# Patient Record
Sex: Male | Born: 1980 | Race: White | Hispanic: No | Marital: Single | Smoking: Never smoker
Health system: Southern US, Community
[De-identification: ages and names within clinical notes are randomized; demographics above are authoritative.]

## PROBLEM LIST (undated history)

## (undated) DIAGNOSIS — E785 Hyperlipidemia, unspecified: Secondary | ICD-10-CM

## (undated) DIAGNOSIS — Q984 Klinefelter syndrome, unspecified: Secondary | ICD-10-CM

## (undated) DIAGNOSIS — G8929 Other chronic pain: Secondary | ICD-10-CM

## (undated) DIAGNOSIS — M549 Dorsalgia, unspecified: Secondary | ICD-10-CM

## (undated) DIAGNOSIS — R519 Headache, unspecified: Secondary | ICD-10-CM

## (undated) DIAGNOSIS — K219 Gastro-esophageal reflux disease without esophagitis: Secondary | ICD-10-CM

## (undated) DIAGNOSIS — F419 Anxiety disorder, unspecified: Secondary | ICD-10-CM

## (undated) DIAGNOSIS — R51 Headache: Secondary | ICD-10-CM

## (undated) DIAGNOSIS — J40 Bronchitis, not specified as acute or chronic: Secondary | ICD-10-CM

## (undated) DIAGNOSIS — E291 Testicular hypofunction: Secondary | ICD-10-CM

## (undated) HISTORY — DX: Other chronic pain: G89.29

## (undated) HISTORY — DX: Headache, unspecified: R51.9

## (undated) HISTORY — DX: Anxiety disorder, unspecified: F41.9

## (undated) HISTORY — DX: Gastro-esophageal reflux disease without esophagitis: K21.9

## (undated) HISTORY — DX: Testicular hypofunction: E29.1

## (undated) HISTORY — PX: HERNIA REPAIR: SHX51

## (undated) HISTORY — DX: Headache: R51

## (undated) HISTORY — DX: Hyperlipidemia, unspecified: E78.5

## (undated) HISTORY — PX: NECK SURGERY: SHX720

## (undated) HISTORY — DX: Dorsalgia, unspecified: M54.9

## (undated) HISTORY — DX: Klinefelter syndrome, unspecified: Q98.4

---

## 2005-07-25 ENCOUNTER — Emergency Department (HOSPITAL_COMMUNITY): Admission: EM | Admit: 2005-07-25 | Discharge: 2005-07-25 | Payer: Self-pay | Admitting: Emergency Medicine

## 2005-10-16 ENCOUNTER — Emergency Department (HOSPITAL_COMMUNITY): Admission: EM | Admit: 2005-10-16 | Discharge: 2005-10-16 | Payer: Self-pay | Admitting: Emergency Medicine

## 2006-03-19 ENCOUNTER — Emergency Department (HOSPITAL_COMMUNITY): Admission: EM | Admit: 2006-03-19 | Discharge: 2006-03-20 | Payer: Self-pay | Admitting: Emergency Medicine

## 2006-12-14 ENCOUNTER — Emergency Department (HOSPITAL_COMMUNITY): Admission: EM | Admit: 2006-12-14 | Discharge: 2006-12-14 | Payer: Self-pay | Admitting: Emergency Medicine

## 2006-12-19 ENCOUNTER — Emergency Department (HOSPITAL_COMMUNITY): Admission: EM | Admit: 2006-12-19 | Discharge: 2006-12-20 | Payer: Self-pay | Admitting: Emergency Medicine

## 2007-01-25 ENCOUNTER — Emergency Department (HOSPITAL_COMMUNITY): Admission: EM | Admit: 2007-01-25 | Discharge: 2007-01-26 | Payer: Self-pay | Admitting: Emergency Medicine

## 2007-03-13 ENCOUNTER — Emergency Department (HOSPITAL_COMMUNITY): Admission: EM | Admit: 2007-03-13 | Discharge: 2007-03-13 | Payer: Self-pay | Admitting: Emergency Medicine

## 2007-04-25 ENCOUNTER — Emergency Department (HOSPITAL_COMMUNITY): Admission: EM | Admit: 2007-04-25 | Discharge: 2007-04-25 | Payer: Self-pay | Admitting: Emergency Medicine

## 2007-09-02 ENCOUNTER — Emergency Department (HOSPITAL_COMMUNITY): Admission: EM | Admit: 2007-09-02 | Discharge: 2007-09-02 | Payer: Self-pay | Admitting: Emergency Medicine

## 2008-11-29 ENCOUNTER — Emergency Department (HOSPITAL_COMMUNITY): Admission: EM | Admit: 2008-11-29 | Discharge: 2008-11-29 | Payer: Self-pay | Admitting: Emergency Medicine

## 2008-12-01 ENCOUNTER — Emergency Department (HOSPITAL_COMMUNITY): Admission: EM | Admit: 2008-12-01 | Discharge: 2008-12-01 | Payer: Self-pay | Admitting: Emergency Medicine

## 2008-12-02 ENCOUNTER — Emergency Department (HOSPITAL_COMMUNITY): Admission: EM | Admit: 2008-12-02 | Discharge: 2008-12-02 | Payer: Self-pay | Admitting: Emergency Medicine

## 2009-05-09 ENCOUNTER — Emergency Department (HOSPITAL_COMMUNITY): Admission: EM | Admit: 2009-05-09 | Discharge: 2009-05-09 | Payer: Self-pay | Admitting: Emergency Medicine

## 2009-05-09 ENCOUNTER — Ambulatory Visit: Payer: Self-pay | Admitting: Psychiatry

## 2009-05-10 ENCOUNTER — Inpatient Hospital Stay (HOSPITAL_COMMUNITY): Admission: AD | Admit: 2009-05-10 | Discharge: 2009-05-12 | Payer: Self-pay | Admitting: Psychiatry

## 2009-07-16 ENCOUNTER — Emergency Department: Payer: Self-pay | Admitting: Emergency Medicine

## 2010-01-03 ENCOUNTER — Emergency Department (HOSPITAL_COMMUNITY)
Admission: EM | Admit: 2010-01-03 | Discharge: 2010-01-04 | Payer: Self-pay | Source: Home / Self Care | Admitting: Emergency Medicine

## 2010-01-26 ENCOUNTER — Ambulatory Visit (HOSPITAL_COMMUNITY)
Admission: RE | Admit: 2010-01-26 | Discharge: 2010-01-26 | Payer: Self-pay | Source: Home / Self Care | Attending: Family Medicine | Admitting: Family Medicine

## 2010-04-25 LAB — BASIC METABOLIC PANEL
BUN: 16 mg/dL (ref 6–23)
CO2: 27 mEq/L (ref 19–32)
Calcium: 8.7 mg/dL (ref 8.4–10.5)
Chloride: 99 mEq/L (ref 96–112)
Creatinine, Ser: 1.42 mg/dL (ref 0.4–1.5)
GFR calc Af Amer: >60 mL/min (ref >60)
Glucose, Bld: 95 mg/dL (ref 70–99)

## 2010-04-25 LAB — POCT CARDIAC MARKERS: Myoglobin, poc: 72.6 ng/mL (ref 12–200)

## 2010-04-25 LAB — RAPID URINE DRUG SCREEN, HOSP PERFORMED
Amphetamines: NOT DETECTED
Barbiturates: NOT DETECTED
Benzodiazepines: POSITIVE — AB
Tetrahydrocannabinol: NOT DETECTED

## 2010-04-25 LAB — DIFFERENTIAL
Basophils Absolute: 0 10*3/uL (ref 0.0–0.1)
Basophils Relative: 0 % (ref 0–1)
Eosinophils Absolute: 0.2 10*3/uL (ref 0.0–0.7)
Monocytes Relative: 7 % (ref 3–12)
Neutro Abs: 4.4 10*3/uL (ref 1.7–7.7)
Neutrophils Relative %: 59 % (ref 43–77)

## 2010-04-25 LAB — CBC
MCHC: 34.3 g/dL (ref 30.0–36.0)
MCV: 86.4 fL (ref 78.0–100.0)
Platelets: 287 10*3/uL (ref 150–400)
RDW: 15 % (ref 11.5–15.5)

## 2010-04-25 LAB — VALPROIC ACID LEVEL: Valproic Acid Lvl: 22 ug/mL — ABNORMAL LOW (ref 50.0–100.0)

## 2010-05-10 LAB — CULTURE, ROUTINE-ABSCESS

## 2010-11-02 LAB — URINE MICROSCOPIC-ADD ON

## 2010-11-02 LAB — POCT I-STAT, CHEM 8
BUN: 15
Calcium, Ion: 1.22
Chloride: 105
Creatinine, Ser: 1.4
Glucose, Bld: 93
HCT: 41
Hemoglobin: 13.9
Potassium: 3.9
Sodium: 139
TCO2: 29

## 2010-11-02 LAB — URINALYSIS, ROUTINE W REFLEX MICROSCOPIC
Bilirubin Urine: NEGATIVE
Glucose, UA: NEGATIVE
Ketones, ur: NEGATIVE
Leukocytes, UA: NEGATIVE
Nitrite: NEGATIVE
Protein, ur: NEGATIVE
Specific Gravity, Urine: 1.021
Urobilinogen, UA: 0.2
pH: 5.5

## 2010-11-02 LAB — URINE CULTURE
Colony Count: NO GROWTH
Culture: NO GROWTH

## 2010-12-31 ENCOUNTER — Emergency Department (HOSPITAL_COMMUNITY): Payer: Self-pay

## 2010-12-31 ENCOUNTER — Emergency Department (HOSPITAL_COMMUNITY)
Admission: EM | Admit: 2010-12-31 | Discharge: 2011-01-01 | Disposition: A | Discharge status: Home / Self Care | Payer: Self-pay | Attending: Emergency Medicine | Admitting: Emergency Medicine

## 2010-12-31 DIAGNOSIS — R109 Unspecified abdominal pain: Secondary | ICD-10-CM | POA: Insufficient documentation

## 2010-12-31 DIAGNOSIS — Z87442 Personal history of urinary calculi: Secondary | ICD-10-CM | POA: Insufficient documentation

## 2010-12-31 DIAGNOSIS — M549 Dorsalgia, unspecified: Secondary | ICD-10-CM | POA: Insufficient documentation

## 2010-12-31 LAB — URINALYSIS, ROUTINE W REFLEX MICROSCOPIC
Glucose, UA: NEGATIVE mg/dL
Hgb urine dipstick: NEGATIVE
Specific Gravity, Urine: 1.01 (ref 1.005–1.030)
pH: 6 (ref 5.0–8.0)

## 2010-12-31 NOTE — ED Notes (Signed)
MD at bedside. 

## 2010-12-31 NOTE — ED Notes (Signed)
Pt states has had increasing flank pain bilaterally x 2 weeks. Pt states urine is very strong and dark colored.  Pt denies fever and vomiting, however has been nauseated.

## 2010-12-31 NOTE — ED Notes (Signed)
bil flank pain --right started 2 weeks ago, left 1 week ago, strong odor to urine.  Pain worse tonight, +nausea-at 11-12am.

## 2010-12-31 NOTE — ED Provider Notes (Signed)
Scribed for Sunnie Nielsen, MD, the patient was seen in room APA15/APA15 . This chart was scribed by Ellie Lunch.   CSN: [id number] Arrival date & time: 12/31/2010 10:40 PM   First MD Initiated Contact with Patient 12/31/10 2305      Chief Complaint  Patient presents with  . Flank Pain    (Consider location/radiation/quality/duration/timing/severity/associated sxs/prior treatment) HPI Pt seen at 11:32 PM  Garrett Daugherty is a 30 y.o. male who presents to the Emergency Department complaining of 2 weeks of constant bilateral flank pain with associated malodorous urine. Pain in right flank is greater than left.  Pain is currently rated 6/10 in severity. Pt denies associated hematuria, dysuria, fevers, n/v/d. Pt has h/o of kidney stones. Denies that current Sx are similar to previous kidney stones. Pt treated with IB profen this morning with no improvement.  PCP Dr. Lubertha South  History reviewed. No pertinent past medical history.  History reviewed. No pertinent past surgical history.  No family history on file.  History  Substance Use Topics  . Smoking status: Never Smoker   . Smokeless tobacco: Not on file  . Alcohol Use: No     Review of Systems 10 Systems reviewed and are negative for acute change except as noted in the HPI.  Allergies  Keflex; Penicillins; and Sulfa antibiotics  Home Medications   Current Outpatient Rx  Name Route Sig Dispense Refill  . AMITRIPTYLINE HCL 25 MG PO TABS Oral Take 25 mg by mouth 2 (two) times daily.      Marland Kitchen DIAZEPAM 5 MG PO TABS Oral Take 5 mg by mouth 4 (four) times daily.      Marland Kitchen HYDROCODONE-ACETAMINOPHEN 10-325 MG PO TABS Oral Take 1 tablet by mouth every 6 (six) hours as needed. For pain     . IBUPROFEN 800 MG PO TABS Oral Take 800 mg by mouth daily as needed. For pain     . TESTOSTERONE 1.25 GM/ACT (1%) TD GEL Transdermal Place 2 packets onto the skin daily.        BP 116/88  Pulse 80  Temp(Src) 98.8 F (37.1 C) (Oral)  Resp  20  Ht 6\' 1"  (1.854 m)  Wt 230 lb (104.327 kg)  BMI 30.34 kg/m2  SpO2 100%  Physical Exam  Nursing note and vitals reviewed. Constitutional: He is oriented to person, place, and time. He appears well-developed and well-nourished.  HENT:  Head: Normocephalic and atraumatic.  Eyes: Conjunctivae and EOM are normal.  Neck: Neck supple.  Cardiovascular: Normal rate and regular rhythm.   Pulmonary/Chest: Effort normal. No respiratory distress.  Abdominal: Soft. There is no tenderness.       Pt. Localizes pain in right flank more than left flank. No cva tenderness on examination  Neurological: He is alert and oriented to person, place, and time.  Skin: Skin is warm and dry.    ED Course  Procedures (including critical care time) OTHER DATA REVIEWED: Nursing notes, vital signs, and past medical records reviewed.   DIAGNOSTIC STUDIES: Oxygen Saturation is 100% on room air, normal by my interpretation.     Results for orders placed during the hospital encounter of 12/31/10  URINALYSIS, ROUTINE W REFLEX MICROSCOPIC      Component Value Range   Color, Urine STRAW (*) YELLOW    Appearance CLEAR  CLEAR    Specific Gravity, Urine 1.010  1.005 - 1.030    pH 6.0  5.0 - 8.0    Glucose, UA NEGATIVE  NEGATIVE (mg/dL)  Hgb urine dipstick NEGATIVE  NEGATIVE    Bilirubin Urine NEGATIVE  NEGATIVE    Ketones, ur NEGATIVE  NEGATIVE (mg/dL)   Protein, ur NEGATIVE  NEGATIVE (mg/dL)   Urobilinogen, UA 0.2  0.0 - 1.0 (mg/dL)   Nitrite NEGATIVE  NEGATIVE    Leukocytes, UA NEGATIVE  NEGATIVE    Ct Abdomen Pelvis Wo Contrast  01/01/2011  *RADIOLOGY REPORT*  Clinical Data: Bilateral flank pain and history of renal calculi.  CT ABDOMEN AND PELVIS WITHOUT CONTRAST  Technique:  Multidetector CT imaging of the abdomen and pelvis was performed following the standard protocol without intravenous contrast.  Comparison: 09/02/2007  Findings: No evidence of hydronephrosis or intrarenal calculi bilaterally.   The ureters are normal without calculi or dilatation. The bladder is unremarkable.  A small left inguinal hernia is present containing fat.  No evidence of inflammation or fluid in the small hernia.  Bowel loops are unremarkable.  Normal appendix is visualized.  No inflammatory process, abnormal fluid collection or mass identified by unenhanced CT.  Unenhanced solid organs in the abdomen are unremarkable.  No abnormal calcifications.  Bony structures are within normal limits.  IMPRESSION:  1.  No evidence of renal calculi, ureteral calculi or obstruction. 2.  Small left inguinal hernia containing fat.  Original Report Authenticated By: Reola Calkins, M.D.    MDM   Flank pain with UA and CT scan reviewed as above. No obvious etiology for symptoms by workup. Urine culture sent. Patient does have history of back pain and may be the etiology for his symptoms tonight. Pain improved emergency department with IM Dilaudid. Plan discharge home with Flexeril and NSAIDs as needed. Patient agrees to followup with his physician for recheck and results of urine culture.  I personally performed the services described in this documentation, which was scribed in my presence. The recorded information has been reviewed and considered.         Sunnie Nielsen, MD 01/01/11 [phone]

## 2011-01-01 MED ORDER — CYCLOBENZAPRINE HCL 10 MG PO TABS: 10.0000 mg | ORAL_TABLET | Freq: Two times a day (BID) | ORAL | Status: AC | PRN

## 2011-01-01 MED ORDER — IBUPROFEN 800 MG PO TABS: 800.0000 mg | ORAL_TABLET | Freq: Three times a day (TID) | ORAL | Status: AC

## 2011-01-01 MED ORDER — HYDROMORPHONE HCL PF 1 MG/ML IJ SOLN
1.0000 mg | Freq: Once | INTRAMUSCULAR | Status: AC
  Administered 2011-01-01: 1 mg via INTRAMUSCULAR
  Filled 2011-01-01: qty 1

## 2011-01-01 NOTE — ED Notes (Signed)
Pt refused pain medication at this time. 

## 2011-01-01 NOTE — ED Notes (Signed)
Pt states pain is worsening, states would like to "cahnge his mind now and have pain medication at this time" .  Pt states has a safe ride home.

## 2011-01-02 LAB — URINE CULTURE
Colony Count: NO GROWTH
Culture: NO GROWTH

## 2011-02-05 ENCOUNTER — Emergency Department: Payer: Self-pay | Admitting: Emergency Medicine

## 2011-02-06 ENCOUNTER — Emergency Department (HOSPITAL_COMMUNITY)
Admission: EM | Admit: 2011-02-06 | Discharge: 2011-02-06 | Disposition: A | Discharge status: Home / Self Care | Payer: Self-pay | Attending: Emergency Medicine | Admitting: Emergency Medicine

## 2011-02-06 ENCOUNTER — Encounter (HOSPITAL_COMMUNITY): Payer: Self-pay | Admitting: Oncology

## 2011-02-06 DIAGNOSIS — R05 Cough: Secondary | ICD-10-CM | POA: Insufficient documentation

## 2011-02-06 DIAGNOSIS — Z79899 Other long term (current) drug therapy: Secondary | ICD-10-CM | POA: Insufficient documentation

## 2011-02-06 DIAGNOSIS — IMO0001 Reserved for inherently not codable concepts without codable children: Secondary | ICD-10-CM | POA: Insufficient documentation

## 2011-02-06 DIAGNOSIS — B349 Viral infection, unspecified: Secondary | ICD-10-CM

## 2011-02-06 DIAGNOSIS — B9789 Other viral agents as the cause of diseases classified elsewhere: Secondary | ICD-10-CM | POA: Insufficient documentation

## 2011-02-06 DIAGNOSIS — R070 Pain in throat: Secondary | ICD-10-CM | POA: Insufficient documentation

## 2011-02-06 DIAGNOSIS — H9209 Otalgia, unspecified ear: Secondary | ICD-10-CM | POA: Insufficient documentation

## 2011-02-06 DIAGNOSIS — R509 Fever, unspecified: Secondary | ICD-10-CM | POA: Insufficient documentation

## 2011-02-06 DIAGNOSIS — J3489 Other specified disorders of nose and nasal sinuses: Secondary | ICD-10-CM | POA: Insufficient documentation

## 2011-02-06 DIAGNOSIS — R059 Cough, unspecified: Secondary | ICD-10-CM | POA: Insufficient documentation

## 2011-02-06 HISTORY — DX: Bronchitis, not specified as acute or chronic: J40

## 2011-02-06 MED ORDER — HYDROCOD POLST-CHLORPHEN POLST 10-8 MG/5ML PO LQCR: 5.0000 mL | Freq: Two times a day (BID) | ORAL | Status: DC | PRN

## 2011-02-06 MED ORDER — ALBUTEROL SULFATE HFA 108 (90 BASE) MCG/ACT IN AERS
2.0000 | INHALATION_SPRAY | RESPIRATORY_TRACT | Status: DC | PRN
  Administered 2011-02-06: 2 via RESPIRATORY_TRACT
  Filled 2011-02-06: qty 6.7

## 2011-02-06 NOTE — ED Notes (Signed)
Alert, NAD.  Sick for 3-4 days, sinus and chest congestion. Went to [facility] yesterday, but was not seen.  Says he felt feverish today.

## 2011-02-06 NOTE — ED Notes (Signed)
Pt reports at least 2 days of cold/URI symptoms, sore throat, right earache, fever x today.  Denies n/v/d.  Pt states he has not taken meds for his syx except throat spray.

## 2011-02-07 NOTE — ED Provider Notes (Signed)
History     CSN: [id number]  Arrival date & time 02/06/11  1116   First MD Initiated Contact with Patient 02/06/11 1318      Chief Complaint  Patient presents with  . Cough  . URI  . Sore Throat  . Otalgia    (Consider location/radiation/quality/duration/timing/severity/associated sxs/prior treatment) Patient is a 31 y.o. male presenting with cough, URI, pharyngitis, and ear pain. The history is provided by the patient.  Cough This is a new problem. The current episode started 2 days ago. The problem occurs constantly. The problem has been gradually worsening. The cough is non-productive. The maximum temperature recorded prior to his arrival was 100 to 100.9 F. The fever has been present for less than 1 day. Associated symptoms include chills, ear pain, rhinorrhea, sore throat and myalgias. Pertinent negatives include no chest pain, no shortness of breath and no wheezing. He has tried nothing for the symptoms. The treatment provided no relief. He is not a smoker. His past medical history is significant for bronchitis. His past medical history does not include pneumonia.  URI The primary symptoms include fever, ear pain, sore throat, cough and myalgias. Primary symptoms do not include wheezing, abdominal pain or vomiting. The current episode started 2 days ago. This is a new problem.  The sore throat is not accompanied by trouble swallowing.  Symptoms associated with the illness include chills, congestion and rhinorrhea.  Sore Throat Associated symptoms include chills, congestion, coughing, a fever, myalgias and a sore throat. Pertinent negatives include no abdominal pain, chest pain, neck pain or vomiting.  Otalgia Associated symptoms include rhinorrhea, sore throat and cough. Pertinent negatives include no abdominal pain, no diarrhea, no vomiting and no neck pain.    Past Medical History  Diagnosis Date  . Bronchitis     Past Surgical History  Procedure Date  . Neck surgery    "cysts in neck"    No family history on file.  History  Substance Use Topics  . Smoking status: Never Smoker   . Smokeless tobacco: Not on file  . Alcohol Use: No      Review of Systems  Constitutional: Positive for fever and chills. Negative for activity change and appetite change.  HENT: Positive for ear pain, congestion, sore throat and rhinorrhea. Negative for trouble swallowing, neck pain and neck stiffness.   Respiratory: Positive for cough. Negative for chest tightness, shortness of breath and wheezing.   Cardiovascular: Negative for chest pain.  Gastrointestinal: Negative for vomiting, abdominal pain and diarrhea.  Musculoskeletal: Positive for myalgias.  Skin: Negative.   Hematological: Negative for adenopathy.  All other systems reviewed and are negative.    Allergies  Amoxicillin; Keflex; Penicillins; and Sulfa antibiotics  Home Medications   Current Outpatient Rx  Name Route Sig Dispense Refill  . DIAZEPAM 5 MG PO TABS Oral Take 5 mg by mouth 4 (four) times daily.     . ADULT MULTIVITAMIN W/MINERALS CH Oral Take 1 tablet by mouth daily.      . THROAT SPRAY MT LIQD Mouth/Throat Use as directed 1 spray in the mouth or throat as needed. Sore Throat     . TESTOSTERONE 1.25 GM/ACT (1%) TD GEL Transdermal Place 2 packets onto the skin daily.      Marland Kitchen HYDROCOD POLST-CPM POLST ER 10-8 MG/5ML PO LQCR Oral Take 5 mLs by mouth every 12 (twelve) hours as needed. 120 mL 0    BP 135/94  Pulse 107  Temp(Src) 99.1 F (37.3 C) (Oral)  Resp 18  Ht 6' (1.829 m)  Wt 230 lb (104.327 kg)  BMI 31.19 kg/m2  SpO2 98%  Physical Exam  Nursing note and vitals reviewed. Constitutional: He is oriented to person, place, and time. He appears well-developed and well-nourished. No distress.  HENT:  Head: Normocephalic and atraumatic.  Right Ear: Tympanic membrane and ear canal normal. No mastoid tenderness.  Left Ear: Tympanic membrane and ear canal normal. No mastoid tenderness.    Nose: Mucosal edema and rhinorrhea present.  Mouth/Throat: Uvula is midline, oropharynx is clear and moist and mucous membranes are normal.  Neck: Normal range of motion. Neck supple.  Cardiovascular: Normal rate, regular rhythm and normal heart sounds.   No murmur heard. Pulmonary/Chest: Effort normal and breath sounds normal. No respiratory distress. He has no wheezes. He has no rales. He exhibits no tenderness.  Musculoskeletal: Normal range of motion.  Lymphadenopathy:    He has no cervical adenopathy.  Neurological: He is alert and oriented to person, place, and time. No cranial nerve deficit. He exhibits normal muscle tone. Coordination normal.  Skin: Skin is warm and dry.    ED Course  Procedures (including critical care time)    1. Viral illness       MDM       Patient is alert, non-toxic appearing.  Vitals stable.  Mucous membranes are moist   Kelli Robeck L. [place], [place] 02/07/11 2130

## 2011-02-08 NOTE — ED Provider Notes (Signed)
Medical screening examination/treatment/procedure(s) were performed by non-physician practitioner and as supervising physician I was immediately available for consultation/collaboration.   Caci Orren W. Chaela Branscum, MD 02/08/11 1558 

## 2011-02-25 ENCOUNTER — Encounter (HOSPITAL_COMMUNITY): Payer: Self-pay | Admitting: *Deleted

## 2011-02-25 ENCOUNTER — Emergency Department (HOSPITAL_COMMUNITY)
Admission: EM | Admit: 2011-02-25 | Discharge: 2011-02-25 | Disposition: A | Discharge status: Home / Self Care | Payer: Self-pay | Attending: Emergency Medicine | Admitting: Emergency Medicine

## 2011-02-25 ENCOUNTER — Emergency Department (HOSPITAL_COMMUNITY): Payer: Self-pay

## 2011-02-25 DIAGNOSIS — M549 Dorsalgia, unspecified: Secondary | ICD-10-CM

## 2011-02-25 DIAGNOSIS — W010XXA Fall on same level from slipping, tripping and stumbling without subsequent striking against object, initial encounter: Secondary | ICD-10-CM | POA: Insufficient documentation

## 2011-02-25 DIAGNOSIS — M545 Low back pain, unspecified: Secondary | ICD-10-CM | POA: Insufficient documentation

## 2011-02-25 DIAGNOSIS — M25559 Pain in unspecified hip: Secondary | ICD-10-CM | POA: Insufficient documentation

## 2011-02-25 MED ORDER — HYDROMORPHONE HCL PF 1 MG/ML IJ SOLN
1.0000 mg | Freq: Once | INTRAMUSCULAR | Status: AC
  Administered 2011-02-25: 1 mg via INTRAMUSCULAR
  Filled 2011-02-25: qty 1

## 2011-02-25 MED ORDER — NAPROXEN 500 MG PO TABS: 500.0000 mg | ORAL_TABLET | Freq: Two times a day (BID) | ORAL | Status: DC

## 2011-02-25 MED ORDER — HYDROCODONE-ACETAMINOPHEN 5-325 MG PO TABS: 1.0000 | ORAL_TABLET | Freq: Four times a day (QID) | ORAL | Status: AC | PRN

## 2011-02-25 NOTE — ED Notes (Signed)
Discharge instructions reviewed; verbalizes understanding.  No questions asked; no further c/o's.  Ambulatory to lobby.

## 2011-02-25 NOTE — ED Notes (Signed)
Pt reports falling last night and now having right hip pain. Ambulatory at triage.

## 2011-02-25 NOTE — ED Notes (Signed)
Patient transported to X-ray 

## 2011-02-25 NOTE — ED Provider Notes (Signed)
History     CSN: [id number]  Arrival date & time 02/25/11  1132   First MD Initiated Contact with Patient 02/25/11 1212      Chief Complaint  Patient presents with  . Fall  . Back Pain    (Consider location/radiation/quality/duration/timing/severity/associated sxs/prior treatment) Patient is a 31 y.o. male presenting with fall and back pain. The history is provided by the patient (The patient states that he was on her front porch and slipped and fell onto his back. Patient complained of mild/moderate low back pain with radiation of the pain down his right thigh. She states he has a history of disc problems in his back and is taking ). No language interpreter was used.  Fall The accident occurred yesterday. The fall occurred while walking. He fell from a height of 1 to 2 ft. He landed on a hard floor. There was no blood loss. Point of impact: back. The pain is at a severity of 4/10. The pain is moderate. He was ambulatory at the scene. There was no drug use involved in the accident. Pertinent negatives include no visual change, no fever, no abdominal pain, no hematuria and no headaches. The symptoms are aggravated by activity. He has tried nothing for the symptoms.  Back Pain  Pertinent negatives include no chest pain, no fever, no headaches and no abdominal pain.    Past Medical History  Diagnosis Date  . Bronchitis     Past Surgical History  Procedure Date  . Neck surgery     "cysts in neck"    History reviewed. No pertinent family history.  History  Substance Use Topics  . Smoking status: Never Smoker   . Smokeless tobacco: Not on file  . Alcohol Use: No      Review of Systems  Constitutional: Negative for fever and fatigue.  HENT: Negative for congestion, sinus pressure and ear discharge.   Eyes: Negative for discharge.  Respiratory: Negative for cough.   Cardiovascular: Negative for chest pain.  Gastrointestinal: Negative for abdominal pain and diarrhea.    Genitourinary: Negative for frequency and hematuria.  Musculoskeletal: Positive for back pain.  Skin: Negative for rash.  Neurological: Negative for seizures and headaches.  Hematological: Negative.   Psychiatric/Behavioral: Negative for hallucinations.    Allergies  Amoxicillin; Keflex; Penicillins; and Sulfa antibiotics  Home Medications   Current Outpatient Rx  Name Route Sig Dispense Refill  . ASPIRIN EFFERVESCENT 325 MG PO TBEF Oral Take 325 mg by mouth every 6 (six) hours as needed. For cold    . DIAZEPAM 5 MG PO TABS Oral Take 5 mg by mouth 4 (four) times daily.     . ADULT MULTIVITAMIN W/MINERALS CH Oral Take 1 tablet by mouth daily.      . TESTOSTERONE 1.25 GM/ACT (1%) TD GEL Transdermal Place 2 packets onto the skin daily.      Marland Kitchen HYDROCODONE-ACETAMINOPHEN 5-325 MG PO TABS Oral Take 1 tablet by mouth every 6 (six) hours as needed for pain. 20 tablet 0  . NAPROXEN 500 MG PO TABS Oral Take 1 tablet (500 mg total) by mouth 2 (two) times daily. 30 tablet 0    BP 112/77  Pulse 81  Temp(Src) 98.6 F (37 C) (Oral)  Resp 16  SpO2 98%  Physical Exam  Constitutional: He is oriented to person, place, and time. He appears well-developed.  HENT:  Head: Normocephalic and atraumatic.  Eyes: Conjunctivae and EOM are normal. No scleral icterus.  Neck: Neck supple. No thyromegaly  present.  Cardiovascular: Normal rate and regular rhythm.  Exam reveals no gallop and no friction rub.   No murmur heard. Pulmonary/Chest: No stridor. He has no wheezes. He has no rales. He exhibits no tenderness.  Abdominal: He exhibits no distension. There is no tenderness. There is no rebound.  Musculoskeletal: Normal range of motion. He exhibits no edema.  Lymphadenopathy:    He has no cervical adenopathy.  Neurological: He is oriented to person, place, and time. He displays normal reflexes. No cranial nerve deficit. He exhibits normal muscle tone. Coordination normal.       Pos. Straight leg raise  on right  Skin: No rash noted. No erythema.  Psychiatric: He has a normal mood and affect. His behavior is normal.    ED Course  Procedures (including critical care time)  Labs Reviewed - No data to display Dg Lumbar Spine Complete  02/25/2011  *RADIOLOGY REPORT*  Clinical Data: Low back pain after fall  LUMBAR SPINE - COMPLETE 4+ VIEW  Comparison: 01/03/2010  Findings: No fracture.  No subluxation.  There is some loss of disc height at L1-2.  SI joints are normal in appearance.  IMPRESSION: No acute bony findings.  Original Report Authenticated By: ERIC A. MANSELL, M.D.     1. Back pain    Lumbar strain with sciatica   MDM          Benny Lennert, MD 02/25/11 1355

## 2011-03-11 ENCOUNTER — Emergency Department (HOSPITAL_COMMUNITY)
Admission: EM | Admit: 2011-03-11 | Discharge: 2011-03-11 | Disposition: A | Discharge status: Home / Self Care | Payer: Self-pay | Attending: Emergency Medicine | Admitting: Emergency Medicine

## 2011-03-11 ENCOUNTER — Encounter (HOSPITAL_COMMUNITY): Payer: Self-pay | Admitting: *Deleted

## 2011-03-11 DIAGNOSIS — J029 Acute pharyngitis, unspecified: Secondary | ICD-10-CM | POA: Insufficient documentation

## 2011-03-11 NOTE — ED Notes (Signed)
MD at bedside. 

## 2011-03-11 NOTE — ED Provider Notes (Signed)
History   This chart was scribed for Joya Gaskins, MD by Sofie Rower. The patient was seen in room APA19/APA19 and the patient's care was started at 10:03PM.    CSN: [id number]  Arrival date & time 03/11/11  1931   First MD Initiated Contact with Patient 03/11/11 2155      Chief Complaint  Patient presents with  . Sore Throat     Patient is a 31 y.o. male presenting with pharyngitis. The history is provided by the patient. No language interpreter was used.  Sore Throat This is a new problem. The current episode started more than 2 days ago. The problem occurs constantly. The problem has not changed since onset.Associated symptoms comments: Jaw pain from chewing, ear drainage, coughing, runny nose.  . Exacerbated by: chewing. The symptoms are relieved by nothing. He has tried nothing for the symptoms.   Pt has a hx of cyst removal on his neck at 7 yrs of age. Pt states "he had a cold about a month ago".   PCP is Dr. Gerda Diss.   Past Medical History  Diagnosis Date  . Bronchitis     Past Surgical History  Procedure Date  . Neck surgery     "cysts in neck"    No family history on file.  History  Substance Use Topics  . Smoking status: Never Smoker   . Smokeless tobacco: Not on file  . Alcohol Use: No      Review of Systems  All other systems reviewed and are negative.    Allergies  Amoxicillin; Keflex; Penicillins; Sulfa antibiotics; and Codeine  Home Medications   Current Outpatient Rx  Name Route Sig Dispense Refill  . ASPIRIN EFFERVESCENT 325 MG PO TBEF Oral Take 325 mg by mouth every 6 (six) hours as needed. For cold    . DIAZEPAM 5 MG PO TABS Oral Take 5 mg by mouth 4 (four) times daily.     . ADULT MULTIVITAMIN W/MINERALS CH Oral Take 1 tablet by mouth daily.      . TESTOSTERONE 1.25 GM/ACT (1%) TD GEL Transdermal Place 2 packets onto the skin daily.        BP 133/86  Pulse 71  Temp 98.8 F (37.1 C)  Resp 20  Ht 6' (1.829 m)  Wt 233 lb (105.688  kg)  BMI 31.60 kg/m2  SpO2 98%  Physical Exam  CONSTITUTIONAL: Well developed/well nourished HEAD AND FACE: Normocephalic/atraumatic EYES: EOMI/PERRL ENMT: Mucous membranes moist, uvula midline, no stridor, no trismus, no dental abscess/tenderness voice normal.  No exudates in oropharynx.  No significant erythema noted.  No anterior neck erythema/tenderness noted.  No anterior cervical lymphadeopathy Bilateral TMs clear bilaterally NECK: supple no meningeal signs SPINE:entire spine nontender CV: S1/S2 noted, no murmurs/rubs/gallops noted LUNGS: Lungs are clear to auscultation bilaterally, no apparent distress ABDOMEN: soft, nontender, no rebound or guarding GU:no cva tenderness NEURO: Pt is awake/alert, moves all extremitiesx4 EXTREMITIES: pulses normal, full ROM SKIN: warm, color normal PSYCH: no abnormalities of mood noted   ED Course  Procedures   DIAGNOSTIC STUDIES: Oxygen Saturation is 98% on room air, normal by my interpretation.    COORDINATION OF CARE:    10:07PM- EDP at bedside discusses treatment plan concerning results of strep test, pain management, and follow up with ENT. Pt concerned this could be related to previous h/o "cysts" in neck.  advised ENT f/u The patient appears reasonably screened and/or stabilized for discharge and I doubt any other medical condition or other  EMC requiring further screening, evaluation, or treatment in the ED at this time prior to discharge.    MDM  Nursing notes reviewed and considered in documentation All labs/vitals reviewed and considered     I personally performed the services described in this documentation, which was scribed in my presence. The recorded information has been reviewed and considered.       Joya Gaskins, MD 03/11/11 [phone]

## 2011-03-11 NOTE — ED Notes (Signed)
Pt Dc to home in care of family.

## 2011-03-11 NOTE — ED Notes (Signed)
Sore throat with swelling, states it is hard to swallow

## 2011-03-18 ENCOUNTER — Encounter (HOSPITAL_COMMUNITY): Payer: Self-pay

## 2011-03-18 ENCOUNTER — Emergency Department (HOSPITAL_COMMUNITY)
Admission: EM | Admit: 2011-03-18 | Discharge: 2011-03-18 | Disposition: A | Discharge status: Home / Self Care | Payer: Self-pay | Attending: Emergency Medicine | Admitting: Emergency Medicine

## 2011-03-18 DIAGNOSIS — K047 Periapical abscess without sinus: Secondary | ICD-10-CM | POA: Insufficient documentation

## 2011-03-18 DIAGNOSIS — K029 Dental caries, unspecified: Secondary | ICD-10-CM | POA: Insufficient documentation

## 2011-03-18 MED ORDER — HYDROCODONE-ACETAMINOPHEN 5-325 MG PO TABS: 1.0000 | ORAL_TABLET | ORAL | Status: AC | PRN

## 2011-03-18 MED ORDER — CLINDAMYCIN HCL 150 MG PO CAPS: 150.0000 mg | ORAL_CAPSULE | Freq: Four times a day (QID) | ORAL | Status: AC

## 2011-03-18 NOTE — ED Notes (Signed)
Pain rt mandible.  Says he has a "bad tooth". Has a dental appt for next week.  Says he needs an antibiotic.

## 2011-03-18 NOTE — ED Notes (Signed)
Pt reports having rt bottom toothache. Pt here for antibiotic. Pt unable to see dentist until next week.

## 2011-03-20 NOTE — ED Provider Notes (Signed)
History     CSN: [id number]  Arrival date & time 03/18/11  4098   First MD Initiated Contact with Patient 03/18/11 1137      Chief Complaint  Patient presents with  . Dental Pain    (Consider location/radiation/quality/duration/timing/severity/associated sxs/prior treatment) Patient is a 31 y.o. male presenting with tooth pain. The history is provided by the patient.  Dental PainThe primary symptoms include mouth pain. Primary symptoms do not include headaches, fever, shortness of breath or sore throat.  Additional symptoms include: dental sensitivity to temperature, gum swelling, gum tenderness and jaw pain. Additional symptoms do not include: facial swelling, pain with swallowing, taste disturbance, ear pain and swollen glands.    Past Medical History  Diagnosis Date  . Bronchitis     Past Surgical History  Procedure Date  . Neck surgery     "cysts in neck"    No family history on file.  History  Substance Use Topics  . Smoking status: Never Smoker   . Smokeless tobacco: Not on file  . Alcohol Use: No      Review of Systems  Constitutional: Negative for fever.  HENT: Positive for dental problem. Negative for ear pain, congestion, sore throat, facial swelling and neck pain.   Eyes: Negative.   Respiratory: Negative for chest tightness and shortness of breath.   Cardiovascular: Negative for chest pain.  Gastrointestinal: Negative for nausea and abdominal pain.  Genitourinary: Negative.   Musculoskeletal: Negative for joint swelling and arthralgias.  Skin: Negative.  Negative for rash and wound.  Neurological: Negative for dizziness, weakness, light-headedness, numbness and headaches.  Hematological: Negative.   Psychiatric/Behavioral: Negative.     Allergies  Amoxicillin; Keflex; Penicillins; Sulfa antibiotics; and Codeine  Home Medications   Current Outpatient Rx  Name Route Sig Dispense Refill  . DIAZEPAM 5 MG PO TABS Oral Take 5 mg by mouth 4 (four)  times daily.     . IBUPROFEN 800 MG PO TABS Oral Take 800 mg by mouth every 8 (eight) hours as needed. For pain    . ADULT MULTIVITAMIN W/MINERALS CH Oral Take 1 tablet by mouth daily.      Marland Kitchen CLINDAMYCIN HCL 150 MG PO CAPS Oral Take 1 capsule (150 mg total) by mouth every 6 (six) hours. 28 capsule 0  . HYDROCODONE-ACETAMINOPHEN 5-325 MG PO TABS Oral Take 1 tablet by mouth every 4 (four) hours as needed for pain. 15 tablet 0    BP 124/74  Pulse 75  Temp(Src) 98.3 F (36.8 C) (Oral)  Resp 16  Ht 6' (1.829 m)  Wt 230 lb (104.327 kg)  BMI 31.19 kg/m2  SpO2 98%  Physical Exam  Constitutional: He is oriented to person, place, and time. He appears well-developed and well-nourished. No distress.  HENT:  Head: Normocephalic and atraumatic. No trismus in the jaw.  Right Ear: Tympanic membrane and external ear normal.  Left Ear: Tympanic membrane and external ear normal.  Mouth/Throat: Oropharynx is clear and moist and mucous membranes are normal. No oral lesions. Abnormal dentition. Dental abscesses and dental caries present.    Eyes: Conjunctivae are normal.  Neck: Normal range of motion. Neck supple.  Cardiovascular: Normal rate and normal heart sounds.   Pulmonary/Chest: Effort normal.  Abdominal: He exhibits no distension.  Musculoskeletal: Normal range of motion.  Lymphadenopathy:    He has no cervical adenopathy.  Neurological: He is alert and oriented to person, place, and time.  Skin: Skin is warm and dry. No erythema.  Psychiatric:  He has a normal mood and affect.    ED Course  Procedures (including critical care time)  Labs Reviewed - No data to display No results found.   1. Dental abscess       MDM  Clindamycin,  Hydrocodone.  Pt has appt with a dentist (he cannot recall name) next week.        Candis Musa, PA 03/20/11 1453

## 2011-03-22 NOTE — ED Provider Notes (Signed)
Medical screening examination/treatment/procedure(s) were performed by non-physician practitioner and as supervising physician I was immediately available for consultation/collaboration.   Savyon Loken R Orris Perin, MD 03/22/11 1502 

## 2011-10-21 ENCOUNTER — Other Ambulatory Visit: Payer: Self-pay | Admitting: Family Medicine

## 2011-10-21 ENCOUNTER — Ambulatory Visit (HOSPITAL_COMMUNITY)
Admission: RE | Admit: 2011-10-21 | Discharge: 2011-10-21 | Disposition: A | Discharge status: Home / Self Care | Payer: BC Managed Care – PPO | Source: Ambulatory Visit | Attending: Family Medicine | Admitting: Family Medicine

## 2011-10-21 DIAGNOSIS — M25559 Pain in unspecified hip: Secondary | ICD-10-CM | POA: Insufficient documentation

## 2011-10-21 DIAGNOSIS — M545 Low back pain, unspecified: Secondary | ICD-10-CM

## 2011-12-04 ENCOUNTER — Emergency Department: Payer: Self-pay | Admitting: Emergency Medicine

## 2012-02-06 ENCOUNTER — Other Ambulatory Visit: Payer: Self-pay | Admitting: Anesthesiology

## 2012-02-06 DIAGNOSIS — M549 Dorsalgia, unspecified: Secondary | ICD-10-CM

## 2012-03-13 ENCOUNTER — Other Ambulatory Visit: Payer: Self-pay | Admitting: Family Medicine

## 2012-03-13 ENCOUNTER — Ambulatory Visit (HOSPITAL_COMMUNITY)
Admission: RE | Admit: 2012-03-13 | Discharge: 2012-03-13 | Disposition: A | Discharge status: Home / Self Care | Payer: BC Managed Care – PPO | Source: Ambulatory Visit | Attending: Family Medicine | Admitting: Family Medicine

## 2012-03-13 DIAGNOSIS — R042 Hemoptysis: Secondary | ICD-10-CM | POA: Insufficient documentation

## 2012-03-16 ENCOUNTER — Other Ambulatory Visit: Payer: BC Managed Care – PPO

## 2012-03-16 ENCOUNTER — Inpatient Hospital Stay: Admission: RE | Admit: 2012-03-16 | Payer: BC Managed Care – PPO | Source: Ambulatory Visit

## 2012-05-13 ENCOUNTER — Other Ambulatory Visit: Payer: Self-pay | Admitting: Family Medicine

## 2012-05-13 LAB — LIPID PANEL
Cholesterol: 269 mg/dL — ABNORMAL HIGH (ref 0–200)
Triglycerides: 320 mg/dL — ABNORMAL HIGH (ref <150)

## 2012-05-19 ENCOUNTER — Encounter: Payer: Self-pay | Admitting: Family Medicine

## 2012-07-13 ENCOUNTER — Other Ambulatory Visit: Payer: Self-pay | Admitting: *Deleted

## 2012-07-13 ENCOUNTER — Telehealth: Payer: Self-pay | Admitting: Family Medicine

## 2012-07-13 MED ORDER — OXYCODONE-ACETAMINOPHEN 7.5-325 MG PO TABS: 1.0000 | ORAL_TABLET | Freq: Three times a day (TID) | ORAL | Status: DC | PRN

## 2012-07-13 NOTE — Telephone Encounter (Signed)
Patient needs a refill of his Percocet. He has followup appt for Monday June 16,2014

## 2012-07-13 NOTE — Telephone Encounter (Signed)
Refill times one, patient needs office visit before further.

## 2012-07-13 NOTE — Telephone Encounter (Signed)
Last seen 04/14/2012. No documentation of percocet on that visit. Last documentation was 01/13/12 percocet 7.5/325 #90 one TID 3 scripts given.

## 2012-07-14 ENCOUNTER — Encounter: Payer: Self-pay | Admitting: *Deleted

## 2012-07-14 NOTE — Telephone Encounter (Signed)
Percocet 7.5/325 #90 one TID prn pain script ready for pick up pt notified.

## 2012-07-20 ENCOUNTER — Ambulatory Visit (INDEPENDENT_AMBULATORY_CARE_PROVIDER_SITE_OTHER): Payer: BC Managed Care – PPO | Admitting: Family Medicine

## 2012-07-20 ENCOUNTER — Encounter: Payer: Self-pay | Admitting: Family Medicine

## 2012-07-20 VITALS — BP 108/70 | HR 80 | Wt 234.0 lb

## 2012-07-20 DIAGNOSIS — M549 Dorsalgia, unspecified: Secondary | ICD-10-CM

## 2012-07-20 DIAGNOSIS — G8929 Other chronic pain: Secondary | ICD-10-CM | POA: Insufficient documentation

## 2012-07-20 MED ORDER — OXYCODONE-ACETAMINOPHEN 7.5-325 MG PO TABS: 1.0000 | ORAL_TABLET | Freq: Three times a day (TID) | ORAL | Status: DC | PRN

## 2012-07-20 MED ORDER — NAPROXEN 500 MG PO TABS: 500.0000 mg | ORAL_TABLET | Freq: Two times a day (BID) | ORAL | Status: DC

## 2012-07-20 NOTE — Progress Notes (Signed)
  Subjective:    Patient ID: Garrett Daugherty, male    DOB: 1980/03/05, 31 y.o.   MRN: [id number]  Hand Pain   This patient having moderate hand pain and discomfort that is related to his work. He is having to do a lot of lifting and pulling. Its causing pain and discomfort is mainly in his hands and to some degree and is the. 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 He also has chronic pain discomfort with his back he does not abuse medication he needs refills on this.  In addition to this his testosterone issue is followed by his specialist. In past medical history otherwise normal Family history noncontributory ROSDoes not smoke   Review of Systems Negative for chest pain shortness of breath positive for pains and discomfort in the hands    Objective:   Physical Exam Lungs are clear heart is regular pulses normal blood pressure good extremities no edema subjective discomfort in his hands negative Tinel's       Assessment & Plan:  Hand pain-related to his work. Try Naprosyn 500 mg twice a day when necessary Low testosterone per specialist Chronic pain-3 prescriptions for oxycodone #90. 1 3 times a day 7.5/325 was given. Followup within that timeframe of these prescriptions.

## 2012-07-20 NOTE — Patient Instructions (Addendum)
As part of your visit today we have covered chronic pain. You have been given prescription for pain medicines. Certain pain medications such as hydrocodone  allow for refills. Other pain medications such as oxycodone require separate prescriptions. Nonetheless, your expected to come in for a office visit before further pain medications are issued. We will not refill medications or early nor will we give an extended month supply at the end of these prescriptions.It is your responsibility to keep up with medications. They will not be replaced.  It is your responsibility to schedule an office visit in 3-4 months to be seen before you are out of your medication. Do not call our office to request early refills or additional refills. We are required by law to adhere to regulations. Failure on our part to follow these regulations could jeopardize our prescription license which in turn would cause [place] not to be able to care for you.  Continue to eat healthy and stay active.  Cholesterol Cholesterol is a white, waxy, fat-like protein needed by your body in small amounts. The liver makes all the cholesterol you need. It is carried from the liver by the blood through the blood vessels. Deposits (plaque) may build up on blood vessel walls. This makes the arteries narrower and stiffer. Plaque increases the risk for heart attack and stroke. You cannot feel your cholesterol level even if it is very high. The only way to know is by a blood test to check your lipid (fats) levels. Once you know your cholesterol levels, you should keep a record of the test results. Work with your caregiver to to keep your levels in the desired range. WHAT THE RESULTS MEAN:  Total cholesterol is a rough measure of all the cholesterol in your blood.  LDL is the so-called bad cholesterol. This is the type that deposits cholesterol in the walls of the arteries. You want this level to be low.  HDL is the good cholesterol because it cleans the  arteries and carries the LDL away. You want this level to be high.  Triglycerides are fat that the body can either burn for energy or store. High levels are closely linked to heart disease. DESIRED LEVELS:  Total cholesterol below 200.  LDL below 100 for people at risk, below 70 for very high risk.  HDL above 50 is good, above 60 is best.  Triglycerides below 150. HOW TO LOWER YOUR CHOLESTEROL:  Diet.  Choose fish or white meat chicken and [place], roasted or baked. Limit fatty cuts of red meat, fried foods, and processed meats, such as sausage and lunch meat.  Eat lots of fresh fruits and vegetables. Choose whole grains, beans, pasta, potatoes and cereals.  Use only small amounts of olive, corn or canola oils. Avoid butter, mayonnaise, shortening or palm kernel oils. Avoid foods with trans-fats.  Use skim/nonfat milk and low-fat/nonfat yogurt and cheeses. Avoid whole milk, cream, ice cream, egg yolks and cheeses. Healthy desserts include angel food cake, ginger snaps, animal crackers, hard candy, popsicles, and low-fat/nonfat frozen yogurt. Avoid pastries, cakes, pies and cookies.  Exercise.  A regular program helps decrease LDL and raises HDL.  Helps with weight control.  Do things that increase your activity level like gardening, walking, or taking the stairs.  Medication.  May be prescribed by your caregiver to help lowering cholesterol and the risk for heart disease.  You may need medicine even if your levels are normal if you have several risk factors. HOME CARE INSTRUCTIONS  Follow your diet and exercise programs as suggested by your caregiver.  Take medications as directed.  Have blood work done when your caregiver feels it is necessary. MAKE SURE YOU:   Understand these instructions.  Will watch your condition.  Will get help right away if you are not doing well or get worse. Document Released: 10/16/2000 Document Revised: 04/15/2011 Document Reviewed:  04/08/2007 [facility] Patient Information 2014 [place], [place].  DASH Diet The DASH diet stands for "Dietary Approaches to Stop Hypertension." It is a healthy eating plan that has been shown to reduce high blood pressure (hypertension) in as little as 14 days, while also possibly providing other significant health benefits. These other health benefits include reducing the risk of breast cancer after menopause and reducing the risk of type 2 diabetes, heart disease, colon cancer, and stroke. Health benefits also include weight loss and slowing kidney failure in patients with chronic kidney disease.  DIET GUIDELINES  Limit salt (sodium). Your diet should contain less than 1500 mg of sodium daily.  Limit refined or processed carbohydrates. Your diet should include mostly whole grains. Desserts and added sugars should be used sparingly.  Include small amounts of heart-healthy fats. These types of fats include nuts, oils, and tub margarine. Limit saturated and trans fats. These fats have been shown to be harmful in the body. CHOOSING FOODS  The following food groups are based on a 2000 calorie diet. See your Registered Dietitian for individual calorie needs. Grains and Grain Products (6 to 8 servings daily)  Eat More Often: Whole-wheat bread, brown rice, whole-grain or wheat pasta, quinoa, popcorn without added fat or salt (air popped).  Eat Less Often: White bread, white pasta, white rice, cornbread. Vegetables (4 to 5 servings daily)  Eat More Often: Fresh, frozen, and canned vegetables. Vegetables may be raw, steamed, roasted, or grilled with a minimal amount of fat.  Eat Less Often/Avoid: Creamed or fried vegetables. Vegetables in a cheese sauce. Fruit (4 to 5 servings daily)  Eat More Often: All fresh, canned (in natural juice), or frozen fruits. Dried fruits without added sugar. One hundred percent fruit juice ( cup [237 mL] daily).  Eat Less Often: Dried fruits with added sugar. Canned  fruit in light or heavy syrup. [employer], Fish, and Poultry (2 servings or less daily. One serving is 3 to 4 oz [85-114 g]).  Eat More Often: Ninety percent or leaner ground beef, tenderloin, sirloin. Round cuts of beef, chicken breast, [place] breast. All fish. Grill, bake, or broil your meat. Nothing should be fried.  Eat Less Often/Avoid: Fatty cuts of meat, [place], or chicken leg, thigh, or wing. Fried cuts of meat or fish. Dairy (2 to 3 servings)  Eat More Often: Low-fat or fat-free milk, low-fat plain or light yogurt, reduced-fat or part-skim cheese.  Eat Less Often/Avoid: Milk (whole, 2%).Whole milk yogurt. Full-fat cheeses. Nuts, Seeds, and Legumes (4 to 5 servings per week)  Eat More Often: All without added salt.  Eat Less Often/Avoid: Salted nuts and seeds, canned beans with added salt. Fats and Sweets (limited)  Eat More Often: Vegetable oils, tub margarines without trans fats, sugar-free gelatin. Mayonnaise and salad dressings.  Eat Less Often/Avoid: Coconut oils, palm oils, butter, stick margarine, cream, half and half, cookies, candy, pie. FOR MORE INFORMATION The Dash Diet Eating Plan: www.dashdiet.org Document Released: 01/10/2011 Document Revised: 04/15/2011 Document Reviewed: 01/10/2011 [facility] Patient Information 2014 [place], [place].

## 2012-10-23 ENCOUNTER — Ambulatory Visit (INDEPENDENT_AMBULATORY_CARE_PROVIDER_SITE_OTHER): Payer: BC Managed Care – PPO | Admitting: Family Medicine

## 2012-10-23 ENCOUNTER — Encounter: Payer: Self-pay | Admitting: Family Medicine

## 2012-10-23 VITALS — BP 122/88 | Ht 73.0 in | Wt 234.0 lb

## 2012-10-23 DIAGNOSIS — R739 Hyperglycemia, unspecified: Secondary | ICD-10-CM

## 2012-10-23 DIAGNOSIS — T783XXS Angioneurotic edema, sequela: Secondary | ICD-10-CM

## 2012-10-23 DIAGNOSIS — Z79899 Other long term (current) drug therapy: Secondary | ICD-10-CM

## 2012-10-23 DIAGNOSIS — R7309 Other abnormal glucose: Secondary | ICD-10-CM

## 2012-10-23 DIAGNOSIS — G8929 Other chronic pain: Secondary | ICD-10-CM

## 2012-10-23 DIAGNOSIS — E785 Hyperlipidemia, unspecified: Secondary | ICD-10-CM

## 2012-10-23 DIAGNOSIS — M549 Dorsalgia, unspecified: Secondary | ICD-10-CM

## 2012-10-23 LAB — LIPID PANEL
Cholesterol: 271 mg/dL — ABNORMAL HIGH (ref 0–200)
HDL: 33 mg/dL — ABNORMAL LOW (ref >39)
LDL Cholesterol: 206 mg/dL — ABNORMAL HIGH (ref 0–99)
Triglycerides: 161 mg/dL — ABNORMAL HIGH (ref <150)
VLDL: 32 mg/dL (ref 0–40)

## 2012-10-23 LAB — HEPATIC FUNCTION PANEL
ALT: 21 U/L (ref 0–53)
Albumin: 4.2 g/dL (ref 3.5–5.2)
Alkaline Phosphatase: 58 U/L (ref 39–117)
Total Protein: 7.2 g/dL (ref 6.0–8.3)

## 2012-10-23 LAB — HEMOGLOBIN A1C: Mean Plasma Glucose: 117 mg/dL — ABNORMAL HIGH (ref <117)

## 2012-10-23 LAB — GLUCOSE, RANDOM: Glucose, Bld: 97 mg/dL (ref 70–99)

## 2012-10-23 MED ORDER — OXYCODONE-ACETAMINOPHEN 7.5-325 MG PO TABS: 1.0000 | ORAL_TABLET | Freq: Three times a day (TID) | ORAL | Status: DC | PRN

## 2012-10-23 NOTE — Progress Notes (Signed)
  Subjective:    Patient ID: Garrett Daugherty, male    DOB: 1980-02-26, 32 y.o.   MRN: [id number]  HPIHere for a med check. He went to [facility] in July for allergic reaction. They were not sure what caused the reaction. He stoppped taking naproxen.  Had swelling in hands and feet. Had whelps.   Ventolin inhaler was $45 wants to know if it can be switched to something else.   Would like to get cholesterol checked.   This patient was seen today for chronic pain  The medication list was reviewed and updated.  Discussion was held with the patient regarding compliance with pain medication. The patient was advised the importance of maintaining medication and not using illegal substances with these. The patient was educated that we can provide 3 monthly scripts for their medication, it is their responsibility to follow the instructions. Discussion was held with the patient to make sure they're not having significant side effects. Patient is aware that pain medications are meant to minimize the severity of the pain to allow their pain levels to improve to allow for better function. They are aware of that pain medications cannot totally remove their pain.     Review of Systems  Constitutional: Negative for fever and fatigue.  HENT: Positive for congestion.   Respiratory: Positive for cough and wheezing. Negative for shortness of breath.        Seasonal due to lawn mowing  Cardiovascular: Negative for chest pain.  Gastrointestinal: Negative for abdominal pain.       Objective:   Physical Exam  Nursing note and vitals reviewed. Constitutional: He appears well-developed and well-nourished.  HENT:  Head: Normocephalic.  Right Ear: External ear normal.  Left Ear: External ear normal.  Neck: Normal range of motion.  Cardiovascular: Normal rate, regular rhythm and normal heart sounds.   No murmur heard. Pulmonary/Chest: Effort normal and breath sounds normal. No respiratory distress. He  has no wheezes.  Abdominal: Soft. Bowel sounds are normal.          Assessment & Plan:  Angioedema- set up with allergist. He is uncertain what caused this. It's resolved currently. But we will go ahead and set him up with specialist Albuterol inhaler at [facility] cannot afford Ventolin currently he uses it rarely Pain meds refilled times three-he will continue use pain medicine as directed and followup in 3 months F/u 3 months

## 2012-10-23 NOTE — Patient Instructions (Addendum)
As part of your visit today we have covered chronic pain. You have been given prescription for pain medicines. Certain pain medications such as hydrocodone  allow for refills. Other pain medications such as oxycodone require separate prescriptions. Nonetheless, your expected to come in for a office visit before further pain medications are issued.   We will not refill medications or early nor will we give an extended month supply at the end of these prescriptions.It is your responsibility to keep up with medications. They will not be replaced.  It is your responsibility to schedule an office visit in 3-4 months to be seen before you are out of your medication. Do not call our office to request early refills or additional refills.   We are required by law to adhere to regulations. Failure on our part to follow these regulations could jeopardize our prescription license which in turn would cause us not to be able to care for you.  

## 2012-10-27 ENCOUNTER — Other Ambulatory Visit: Payer: Self-pay

## 2012-10-27 MED ORDER — PRAVASTATIN SODIUM 40 MG PO TABS: 40.0000 mg | ORAL_TABLET | Freq: Every day | ORAL | Status: DC

## 2013-01-18 ENCOUNTER — Telehealth: Payer: Self-pay | Admitting: Family Medicine

## 2013-01-18 DIAGNOSIS — K5900 Constipation, unspecified: Secondary | ICD-10-CM

## 2013-01-18 NOTE — Telephone Encounter (Signed)
He could be having opioid induced constipation, can try Miralax first, if this don't help then Amitiza can help. Also Heme cards times 3 to r/o any microscopic blood in stool. Talk with pt and let me know what he wants to do. Also if worse then f/u ov to discuss

## 2013-01-18 NOTE — Addendum Note (Signed)
Addended byOneal Deputy D on: 01/18/2013 02:14 PM   Modules accepted: Orders

## 2013-01-18 NOTE — Telephone Encounter (Signed)
Pt calling to say that he has been having difficulty with regular bowel movements. He has tried many OTC meds to no avail, wants to know if there is anything you can call in for him. Please call into [facility]

## 2013-01-18 NOTE — Telephone Encounter (Signed)
Left message on voicemail to return call.

## 2013-01-18 NOTE — Telephone Encounter (Signed)
Patient tried the Miralax and it does not work for him. He would like to try the Amitiza. He said it is NOT opioid induced constipation! He has an appt with you on Monday. Pt verbalized understanding.

## 2013-01-19 ENCOUNTER — Other Ambulatory Visit: Payer: Self-pay | Admitting: Family Medicine

## 2013-01-19 MED ORDER — LUBIPROSTONE 8 MCG PO CAPS: 8.0000 ug | ORAL_CAPSULE | Freq: Two times a day (BID) | ORAL | Status: DC

## 2013-01-25 ENCOUNTER — Ambulatory Visit (INDEPENDENT_AMBULATORY_CARE_PROVIDER_SITE_OTHER): Payer: BC Managed Care – PPO | Admitting: Family Medicine

## 2013-01-25 ENCOUNTER — Encounter: Payer: Self-pay | Admitting: Family Medicine

## 2013-01-25 VITALS — BP 130/82 | Ht 73.0 in | Wt 239.0 lb

## 2013-01-25 DIAGNOSIS — K5900 Constipation, unspecified: Secondary | ICD-10-CM

## 2013-01-25 DIAGNOSIS — G8929 Other chronic pain: Secondary | ICD-10-CM

## 2013-01-25 DIAGNOSIS — E785 Hyperlipidemia, unspecified: Secondary | ICD-10-CM

## 2013-01-25 DIAGNOSIS — M549 Dorsalgia, unspecified: Secondary | ICD-10-CM

## 2013-01-25 LAB — POC HEMOCCULT BLD/STL (HOME/3-CARD/SCREEN): Card #3 Fecal Occult Blood, POC: NEGATIVE

## 2013-01-25 MED ORDER — OXYCODONE-ACETAMINOPHEN 7.5-325 MG PO TABS: 1.0000 | ORAL_TABLET | Freq: Three times a day (TID) | ORAL | Status: DC | PRN

## 2013-01-25 MED ORDER — ATORVASTATIN CALCIUM 10 MG PO TABS: 10.0000 mg | ORAL_TABLET | Freq: Every day | ORAL | Status: DC

## 2013-01-25 NOTE — Progress Notes (Signed)
   Subjective:    Patient ID: Garrett Daugherty, male    DOB: 1980-09-15, 32 y.o.   MRN: [id number]  HPI Patient is here today for his chronic back pain follow up visit. He has no other concerns at this time. Patient is doing well.  Lipids discussed Does not tolerate pravastatin This patient was seen today for chronic pain Patient with moderate constipation issues. He relates to try some medication for her. Patient does have elevated cholesterol he is at risk of heart disease we talked at length about diet and exercise.  The medication list was reviewed and updated.  Discussion was held with the patient regarding compliance with pain medication. The patient was advised the importance of maintaining medication and not using illegal substances with these. The patient was educated that we can provide 3 monthly scripts for their medication, it is their responsibility to follow the instructions. Discussion was held with the patient to make sure they're not having significant side effects. Patient is aware that pain medications are meant to minimize the severity of the pain to allow their pain levels to improve to allow for better function. They are aware of that pain medications cannot totally remove their pain.    Review of Systems  Constitutional: Negative for activity change, appetite change and fatigue.  Respiratory: Negative for cough and shortness of breath.   Gastrointestinal: Negative for abdominal pain.  Musculoskeletal: Positive for back pain.  Neurological: Negative for headaches.  Psychiatric/Behavioral: Negative for behavioral problems.       Objective:   Physical Exam  Vitals reviewed. Constitutional: He appears well-nourished.  HENT:  Head: Normocephalic.  Cardiovascular: Normal rate, regular rhythm and normal heart sounds.   No murmur heard. Pulmonary/Chest: Effort normal and breath sounds normal.  Musculoskeletal: He exhibits tenderness. He exhibits no edema.  Lumbar  pain  Lymphadenopathy:    He has no cervical adenopathy.  Neurological: He is alert.  Psychiatric: His behavior is normal.          Assessment & Plan:  #1 chronic pain-3 prescriptions given without difficulty. Followup in 3 months. Patient does not abuse medicine. #2 constipation I feel this is related to pain medication. Amitiza a twice a day if ongoing troubles followup. Hemoccult cards negative x3 #3 hyperlipidemia-has not tolerated other statins but he is at higher risk of heart disease he agrees to try Lipitor 10 mg nightly gradually titrated up to find a dose that he will take whether it would be 3 days a week for 7 days a week it is hard to tell at this point Followup in 3 months

## 2013-02-19 ENCOUNTER — Telehealth: Payer: Self-pay | Admitting: Family Medicine

## 2013-02-19 MED ORDER — OXYCODONE-ACETAMINOPHEN 7.5-325 MG PO TABS: 1.0000 | ORAL_TABLET | Freq: Three times a day (TID) | ORAL | Status: DC | PRN

## 2013-02-19 NOTE — Telephone Encounter (Signed)
Please redo, redate , then I'll sign

## 2013-02-19 NOTE — Telephone Encounter (Signed)
Notified patient scripts ready for pick up. 

## 2013-02-19 NOTE — Telephone Encounter (Signed)
oxyCODONE-acetaminophen (PERCOCET) 7.5-325 MG per tablet  Pts, jan 21 and Feb 20th scripts were torn up, see attached on chart Pt brought them back in for us to trade out.   Call pt when done

## 2013-04-26 ENCOUNTER — Encounter: Payer: Self-pay | Admitting: Family Medicine

## 2013-04-26 ENCOUNTER — Ambulatory Visit (INDEPENDENT_AMBULATORY_CARE_PROVIDER_SITE_OTHER): Payer: 59 | Admitting: Family Medicine

## 2013-04-26 VITALS — BP 128/82 | Ht 72.0 in | Wt 245.2 lb

## 2013-04-26 DIAGNOSIS — G8929 Other chronic pain: Secondary | ICD-10-CM

## 2013-04-26 DIAGNOSIS — M549 Dorsalgia, unspecified: Secondary | ICD-10-CM

## 2013-04-26 DIAGNOSIS — E291 Testicular hypofunction: Secondary | ICD-10-CM

## 2013-04-26 MED ORDER — OXYCODONE-ACETAMINOPHEN 7.5-325 MG PO TABS: 1.0000 | ORAL_TABLET | Freq: Three times a day (TID) | ORAL | Status: DC | PRN

## 2013-04-26 NOTE — Progress Notes (Signed)
   Subjective:    Patient ID: Elita BooneJerry W Siefken, male    DOB: [birth year], 33 y.o.   MRN: [id number]  HPI Patient arrives for a follow up on chronic pain management. Patient reports no problems or concerns.  This patient was seen today for chronic pain  The medication list was reviewed and updated.  Discussion was held with the patient regarding compliance with pain medication. The patient was advised the importance of maintaining medication and not using illegal substances with these. The patient was educated that we can provide 3 monthly scripts for their medication, it is their responsibility to follow the instructions. Discussion was held with the patient to make sure they're not having significant side effects. Patient is aware that pain medications are meant to minimize the severity of the pain to allow their pain levels to improve to allow for better function. They are aware of that pain medications cannot totally remove their pain.   He does have intermittent numbness into his hands comes and goes happens once or twice sometimes 3 times a week denies any loss of sensation or strength  Review of Systems  Constitutional: Negative for activity change, appetite change and fatigue.  Respiratory: Negative for cough.   Cardiovascular: Negative for chest pain.  Gastrointestinal: Negative for abdominal pain.  Endocrine: Negative for polydipsia and polyphagia.  Neurological: Negative for weakness.  Psychiatric/Behavioral: Negative for confusion.       Objective:   Physical Exam  Vitals reviewed. Constitutional: He appears well-nourished. No distress.  Cardiovascular: Normal rate, regular rhythm and normal heart sounds.   No murmur heard. Pulmonary/Chest: Effort normal and breath sounds normal. No respiratory distress.  Musculoskeletal: He exhibits no edema.  Lymphadenopathy:    He has no cervical adenopathy.  Neurological: He is alert.  Psychiatric: His behavior is normal.           Assessment & Plan:  Hand neuropathy- splints 2 month , he states that if this doesn't work he will notify us. I told him that if it doesn't work next step would be blood work if that is abnormal ( B12, TSH) then we would do nerve conduction Then blood work-NCV hyperlip- can't tolerate statin-patient stopped taking statin he will try to eat a healthy diet. Chronic pain-with 3 refills given.

## 2013-04-26 NOTE — Patient Instructions (Signed)
As part of your visit today we have covered chronic pain. You have been given prescription for pain medicines. Certain pain medications such as hydrocodone  allow for refills. Other pain medications such as oxycodone require separate prescriptions. Nonetheless, your expected to come in for a office visit before further pain medications are issued.   We will not refill medications or early nor will we give an extended month supply at the end of these prescriptions.It is your responsibility to keep up with medications. They will not be replaced.  It is your responsibility to schedule an office visit in 3-4 months to be seen before you are out of your medication. Do not call our office to request early refills or additional refills.   We are required by law to adhere to regulations. Failure on our part to follow these regulations could jeopardize our prescription license which in turn would cause us not to be able to care for you.  

## 2013-05-23 IMAGING — CR DG LUMBAR SPINE COMPLETE 4+V
5 series · 5 of 5 positions shown · non-contrast
Comparison: 01/03/2010

CLINICAL DATA: Low back pain after fall

LUMBAR SPINE - COMPLETE 4+ VIEW

[t l-spine a.p.]
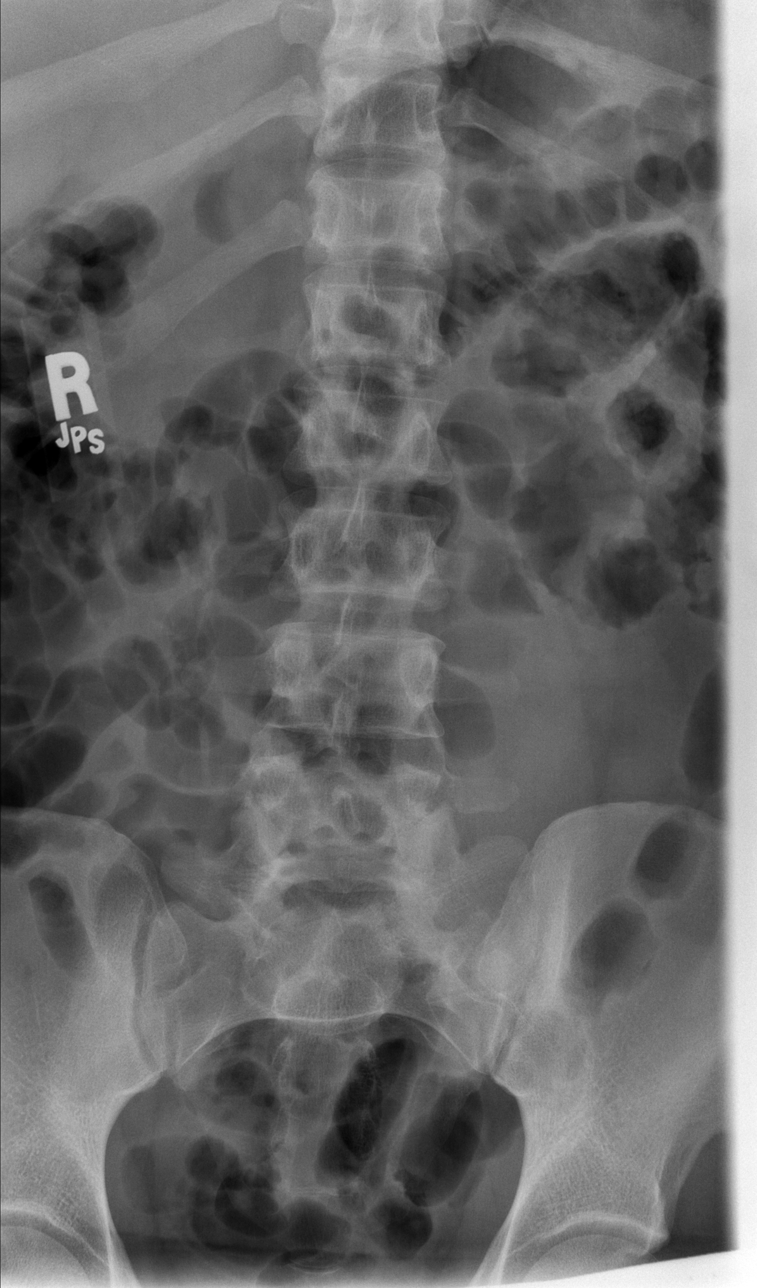

[t l-spine oblique exposure (1 of 2)]
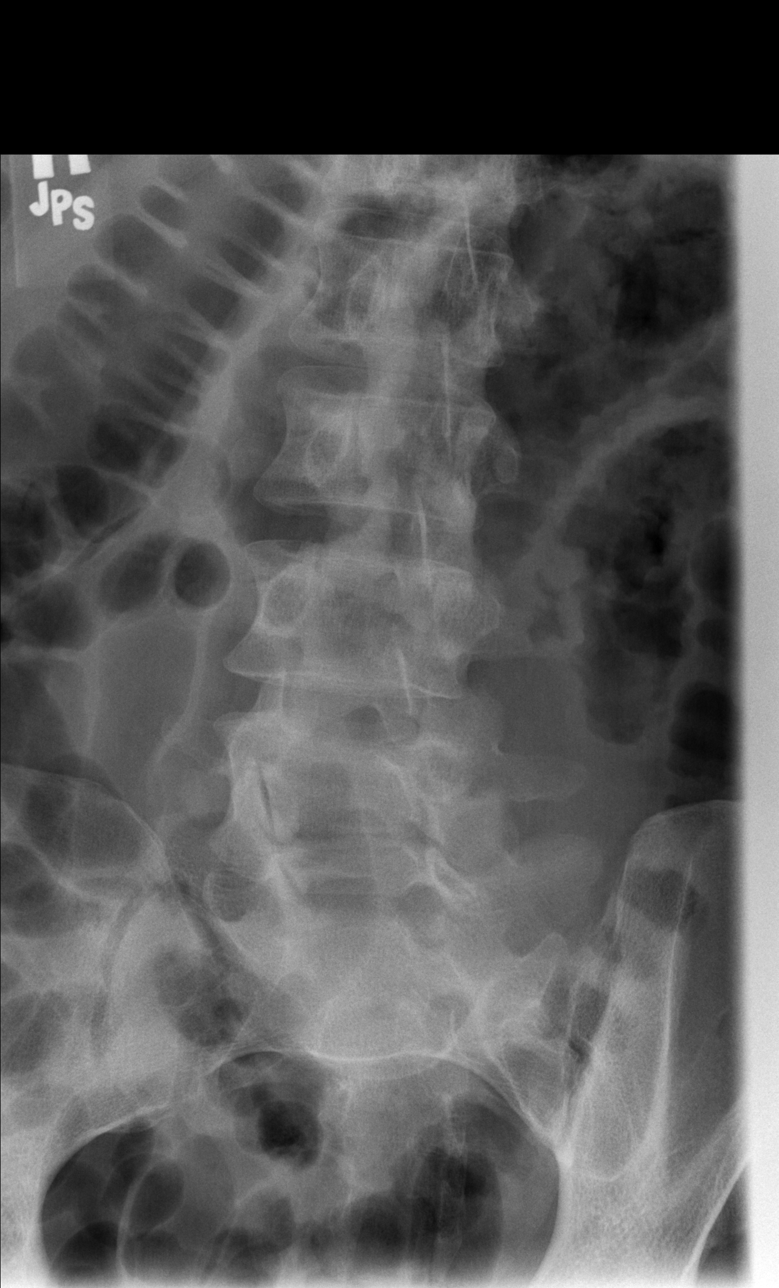

[t l-spine oblique exposure (2 of 2)]
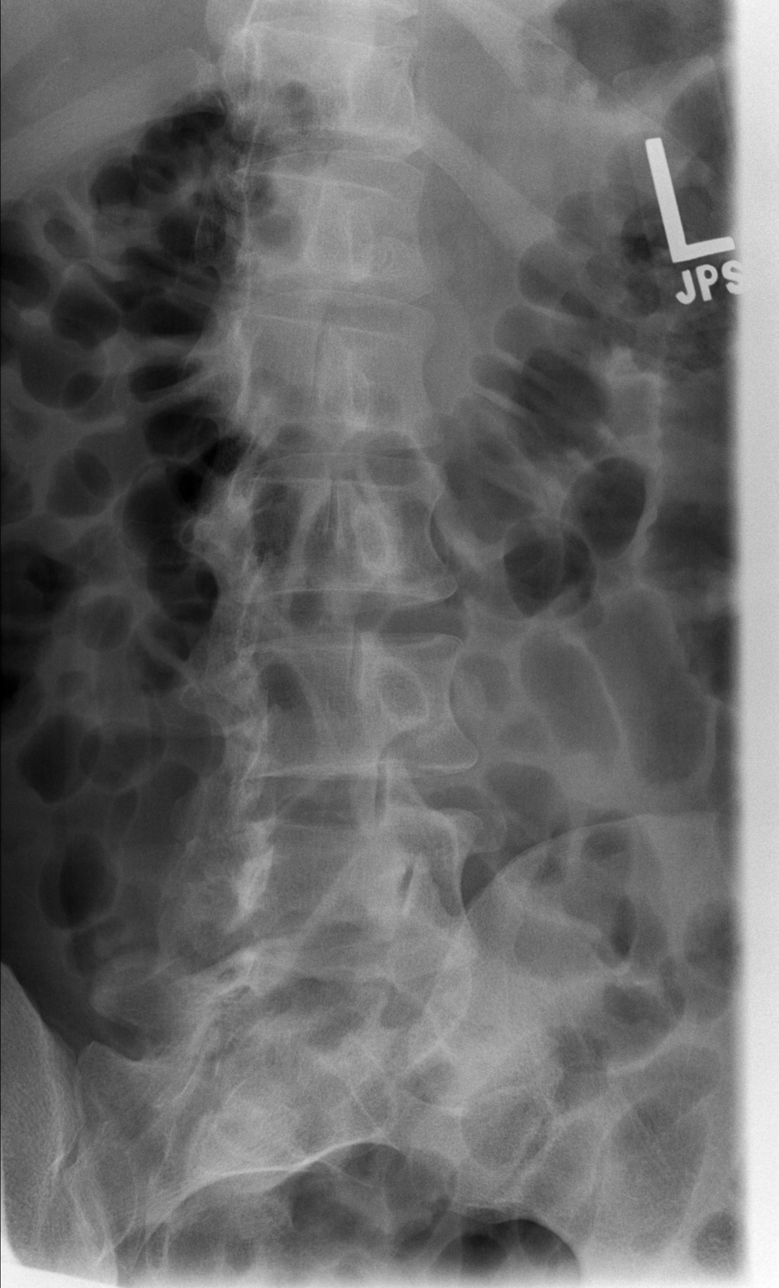

[t l-spine lat]
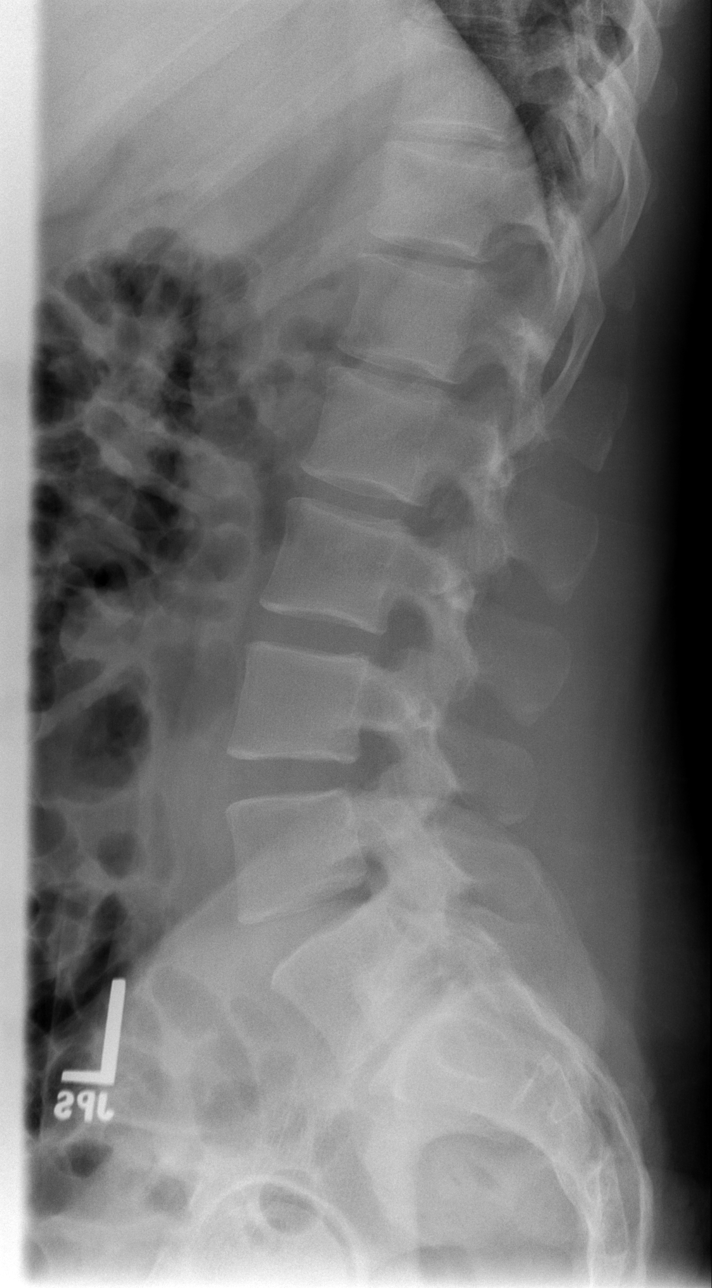

[t l-spine l5-s1 spot]
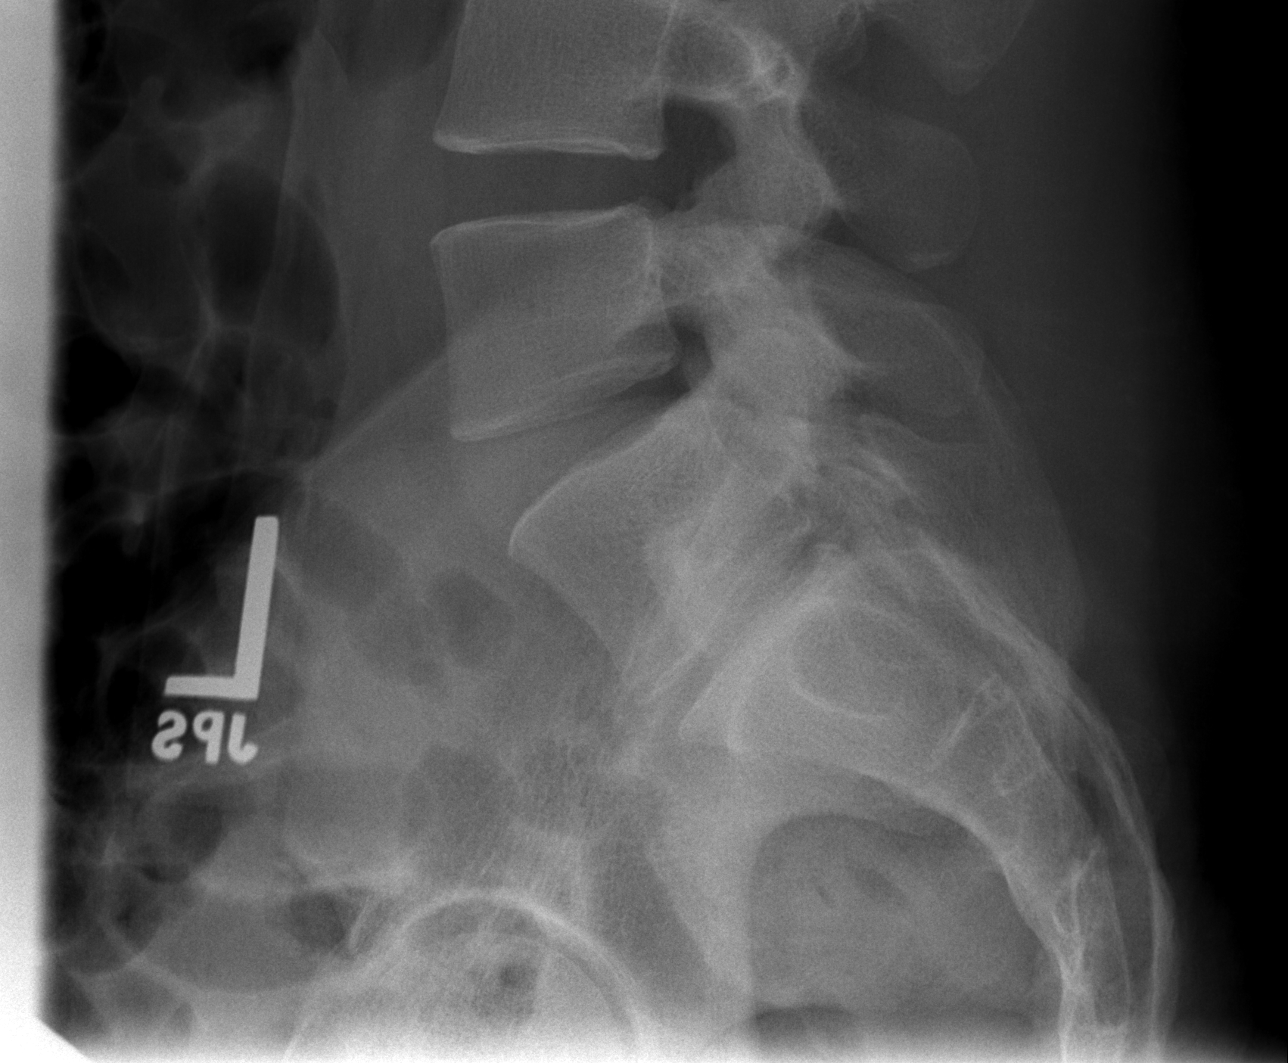

[5 of 5 positions shown; findings below may reference images not displayed]

FINDINGS: No fracture.  No subluxation.  There is some loss of disc
height at L1-2.  SI joints are normal in appearance.
IMPRESSION: No acute bony findings.

## 2013-05-24 ENCOUNTER — Telehealth: Payer: Self-pay | Admitting: Medical

## 2013-05-24 ENCOUNTER — Ambulatory Visit (INDEPENDENT_AMBULATORY_CARE_PROVIDER_SITE_OTHER): Payer: 59 | Admitting: Medical

## 2013-05-24 ENCOUNTER — Encounter: Payer: Self-pay | Admitting: Medical

## 2013-05-24 VITALS — BP 112/80 | HR 62 | Temp 98.3°F | Resp 16 | Wt 234.0 lb

## 2013-05-24 DIAGNOSIS — M549 Dorsalgia, unspecified: Secondary | ICD-10-CM

## 2013-05-24 DIAGNOSIS — Q984 Klinefelter syndrome, unspecified: Secondary | ICD-10-CM

## 2013-05-24 DIAGNOSIS — R7989 Other specified abnormal findings of blood chemistry: Secondary | ICD-10-CM

## 2013-05-24 DIAGNOSIS — E291 Testicular hypofunction: Secondary | ICD-10-CM

## 2013-05-24 DIAGNOSIS — G8929 Other chronic pain: Secondary | ICD-10-CM

## 2013-05-24 NOTE — Progress Notes (Signed)
     Subjective:   Garrett Daugherty is a [age] y.o. male presenting on 05/24/2013 with low tesosterone  Here as a new patient today. Lives in [place], [place].  He is accompanied by his wife today.  States that his primary care provider has been Dr. Karle St[place]Lukens in [place], [place].  Most recently he has been followed by Dr. Tedd SiasSolum, [place]ocrinology in [place], [place], [facility].   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  Diagnosed back in the 1990s. He recently has been giving himself testosterone injections every 6 days 0.4 mg per injection.  However, every time he gets himself a shot now gets a big goose egg swelling and redness at the injection site.  He states that although he is brought this up with his endocrinologist, he feels like they will not change him to something else.  He is afraid to give himself shots at this time, doesn't like the reaction, and doesn't seem to help his sex drive or energy.  He has used AndroGel in the past which worked good, however he feels like his endocrinologist does not want to switch from anything but the injection.  When he call back to ask about this they were going to prescribe AndroGel however his insurer will only pay for Testim.  They will would not allow him to come back any sooner for an appointment given the office's backlog on appt. last testosterone injection was 2 weeks ago.  Of note his chronic pain medications are prescribed by Dr. Benson NorwayLuken in [place].  He states that he has a horrible back, has reportedly talked to surgeons but they do not feel like he is a candidate for surgery.  He has not seen a chronic pain clinic  No other aggravating or relieving factors.  No other complaint.  Review of Systems ROS as in subjective      Objective:     Filed Vitals:   05/24/13 1134  BP: 112/80  Pulse: 62  Temp: 98.3 F (36.8 C)  Resp: 16    General appearance: alert, no distress, WD/WN Neck: supple, no lymphadenopathy, no  thyromegaly, no masses Heart: RRR, normal S1, S2, no murmurs Lungs: CTA bilaterally, no wheezes, rhonchi, or rales Abdomen: +bs, soft, non tender, non distended, no masses, no hepatomegaly, no splenomegaly Pulses: 2+ symmetric, upper and lower extremities, normal cap refill GU: circumcised, hypogonadal on exam bilat, no other mass or nodules, no lympohatnoatpehy, no hernia     Assessment: Encounter Diagnoses  Name Primary?  . Hypogonadism male Yes  . Klinefelter syndrome   . Chronic back pain      Plan: His situation is somewhat unusual today.  There may be just some miscommunication between him, his former primary care provider in [place], and his endocrinologist.  I told them that he can not have 2 primary care providers prescribing his medications.  I advise that we certainly would not be managing his narcotic pain medication.  Once I get his testosterone lab back, I will try to coordinate with endocrinology or Dr. Benson NorwayLuken to figure out a plan going forward. It seems that his main goal is to be put back on topical gel testosterone for better he's abuse and for better symptom  Dorene SorrowJerry was seen today for low tesosterone.  Diagnoses and associated orders for this visit:  Hypogonadism male - Testosterone  Klinefelter syndrome  Chronic back pain    Return pending lab.

## 2013-05-24 NOTE — Telephone Encounter (Signed)
Pt wants to know if he can come back here to see you for the PSA/testosterone management He has tried Dr Fredrich BirksSoloman and now Aleen Campiysinger but is very unhappy with either one. Tysinger told him  He had to switch to him for his PCP but the pt does no want to do that.    Please advise

## 2013-05-25 LAB — TESTOSTERONE: TESTOSTERONE: 38 ng/dL — AB (ref 300–890)

## 2013-05-25 NOTE — Telephone Encounter (Signed)
Patient states that the doctor was following it every 6 months. He states that even with the injections his testosterone levels were very low. His cholesterol levels was very high also. What do you recommend at this point?

## 2013-05-25 NOTE — Telephone Encounter (Signed)
Patient states he agrees and wants to go ahead with the referral to Dr. Fransico HimNida. Referral put in the system.

## 2013-05-25 NOTE — Telephone Encounter (Signed)
It is best to inform the patient that he may end up needing to be under the care of the endocrinologists for this particular issue. There is a good endocrinologists here in [place] Dr Fransico HimNida that I could refer him to if he would like

## 2013-05-25 NOTE — Telephone Encounter (Signed)
Left message on voicemail to return call.

## 2013-05-25 NOTE — Telephone Encounter (Signed)
Please talk with the patient. I imagine his testosterone levels have been followed?. Is this shots doing an adequate job of taking care of this?. If overall stable with testosterone we can reassume this management.

## 2013-06-01 ENCOUNTER — Telehealth: Payer: Self-pay | Admitting: Medical

## 2013-06-01 NOTE — Telephone Encounter (Signed)
That is fine and probably best

## 2013-06-01 NOTE — Telephone Encounter (Signed)
Pt called and stated that he had stopped medical records coming her from other physicians. He states that he does not want to be a patient here after all. Sending this message to ArthurShane as a [employer]

## 2013-06-04 ENCOUNTER — Telehealth: Payer: Self-pay | Admitting: Family Medicine

## 2013-06-04 NOTE — Telephone Encounter (Signed)
Pt is at a loss as to what to do, he states that his testosterone level is 38, his current endocrinologist won't see him until 06/25/13, he no longer wants to do the injections due to causing a rash and leg pain, states Dr. Lynnea FerrierSolomon tried to give Rx for Androgel but it's not covered by his insurance, Dr. Lynnea FerrierSolomon is refusing to change him to Testim which he states is covered until he's seen 06/25/13, pt states he just can't wait that long, he's excessively tired, no energy, no "desire".  (your current order for referral to Dr. Fransico HimNida is on hold due to [facility] not being able to give me referral auth because they do not show Dr. Fransico HimNida as being a participating provider-our lady with [facility] is working on this for me)  Please advise, not sure if pt's wanting referral or for us to call in meds.  Please advise   Pt uses [facility]

## 2013-06-06 NOTE — Telephone Encounter (Signed)
Nurses, talk with the patient. It is unlikely that a referral to endocrinologist we'll get him in before his appointment with Dr. Lynnea FerrierSolomon. I can call in one month prescription for Testim if the patient so desires. Then the specialist can adjust from there

## 2013-06-07 ENCOUNTER — Other Ambulatory Visit: Payer: Self-pay | Admitting: Family Medicine

## 2013-06-07 MED ORDER — TESTOSTERONE 50 MG/5GM (1%) TD GEL: TRANSDERMAL | Status: DC

## 2013-06-07 NOTE — Telephone Encounter (Signed)
Patient states you can send in one month of the Testim and he will follow up with Dr. Lynnea FerrierSolomon on 06/25/13. He uses [facility]. He said thank you very much.

## 2013-06-07 NOTE — Telephone Encounter (Signed)
Script faxed to pharmacy. Left message on voicemail notifying patient.  

## 2013-06-07 NOTE — Telephone Encounter (Signed)
A one-month prescription was prescribed the prescription was printed for the patient to pick up

## 2013-06-10 ENCOUNTER — Telehealth: Payer: Self-pay | Admitting: Family Medicine

## 2013-06-10 NOTE — Telephone Encounter (Signed)
Rx Prior Auth for pt's TESTIM gel was DENIED, per [employer] this product is a plan exclusion, therefore there's no criteria to review & apply, they recommended the pt call the customer service number on the back of their member ID card, explained this to the patient, he's not happy about it and will be calling his insurance company

## 2013-06-11 ENCOUNTER — Telehealth: Payer: Self-pay | Admitting: Family Medicine

## 2013-06-11 NOTE — Telephone Encounter (Signed)
Pt states he can not fill the androgel med his insurance will not cover it His Out of pocket cost is $600  [employer] only pays for  Testium? 1% gel  Can we order this instead [facility]

## 2013-06-14 MED ORDER — TESTOSTERONE 50 MG/5GM (1%) TD GEL: 5.0000 g | Freq: Every day | TRANSDERMAL | Status: DC

## 2013-06-14 NOTE — Telephone Encounter (Signed)
Rx printed and faxed to pharmacy. Patient notified. 

## 2013-06-14 NOTE — Telephone Encounter (Signed)
May prescribed Testim 5g tube used daily. Is possible that this may have to be specifically designated since this is the medicine covered by his insurer, 30 day supply with one refill

## 2013-06-15 ENCOUNTER — Telehealth: Payer: Self-pay | Admitting: Family Medicine

## 2013-06-15 NOTE — Telephone Encounter (Signed)
Garrett Daugherty was DENIED, spoke with patient, he's very frustrated, understandably, explained that I've done all I can do and that his endocrinologist may have a better chance getting his prescription coverage to cover this for him as they may have more documentation for completion of form, pt stated he was going to call his insurance again, I explained that was his choice

## 2013-06-15 NOTE — Telephone Encounter (Signed)
Received another prior auth form for pt's TESTIM 1% gel, completed again, faxed, await decision

## 2013-07-21 ENCOUNTER — Ambulatory Visit: Payer: 59 | Admitting: Family Medicine

## 2013-07-23 ENCOUNTER — Encounter: Payer: Self-pay | Admitting: Family Medicine

## 2013-07-23 ENCOUNTER — Ambulatory Visit (INDEPENDENT_AMBULATORY_CARE_PROVIDER_SITE_OTHER): Payer: 59 | Admitting: Family Medicine

## 2013-07-23 VITALS — BP 130/82 | Ht 72.0 in | Wt 244.1 lb

## 2013-07-23 DIAGNOSIS — G8929 Other chronic pain: Secondary | ICD-10-CM

## 2013-07-23 DIAGNOSIS — M549 Dorsalgia, unspecified: Secondary | ICD-10-CM

## 2013-07-23 MED ORDER — ALPRAZOLAM 0.25 MG PO TABS: 0.2500 mg | ORAL_TABLET | Freq: Two times a day (BID) | ORAL | Status: DC | PRN

## 2013-07-23 MED ORDER — OXYCODONE-ACETAMINOPHEN 7.5-325 MG PO TABS: 1.0000 | ORAL_TABLET | Freq: Three times a day (TID) | ORAL | Status: DC | PRN

## 2013-07-23 NOTE — Progress Notes (Signed)
   Subjective:    Patient ID: Garrett Daugherty, male    DOB: 01/08/1981, 33 y.o.   MRN: [id number]  HPI Patient is here today for a follow up visit on chronic back pain. Patient currently is taking Oxycodone. Patient states he is complaint with medication.    This patient was seen today for chronic pain  The medication list was reviewed and updated.   -Compliance with pain medication: good  The patient was advised the importance of maintaining medication and not using illegal substances with these.  Refills needed: yes  The patient was educated that we can provide 3 monthly scripts for their medication, it is their responsibility to follow the instructions.  Side effects or complications from medications: none  Patient is aware that pain medications are meant to minimize the severity of the pain to allow their pain levels to improve to allow for better function. They are aware of that pain medications cannot totally remove their pain.  Due for UDT ( at least once per year) : deferred to fall   Patient states that he has been experiencing anxiety when he gets behind the wheel to drive. This has been present for 8 years now. Patient states his dad was recently diagnosed with PVC's.    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  Has severe anxiety with flying I gave him a prescription for Xanax low dose 80 use as necessary cautioned drowsiness

## 2013-07-26 ENCOUNTER — Telehealth: Payer: Self-pay | Admitting: Family Medicine

## 2013-07-26 NOTE — Telephone Encounter (Signed)
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   [phone]

## 2013-07-27 NOTE — Telephone Encounter (Signed)
The patient is well aware how to safely take his medicines. I have advised him that the Xanax should not be used at all when he is driving and only in the evening time. As for his chronic pain medicine he has been on this dose for a long span of time and is been safely functioning with it. I told him if he should ever have any drowsiness or other problems he should not take the medicine and dry. I also advised the patient never to use other drugs with it or use alcohol. Patient is well aware of all of this. The form was signed.

## 2013-07-28 ENCOUNTER — Telehealth: Payer: Self-pay | Admitting: Family Medicine

## 2013-07-28 NOTE — Telephone Encounter (Signed)
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   See chart

## 2013-07-28 NOTE — Telephone Encounter (Signed)
Notified pt letter ready for pick up

## 2013-07-29 NOTE — Telephone Encounter (Signed)
I have reviewed the form. 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 Talk with the patient when he calls back. I tried to call him I had to leave a message. 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 Whereas if he takes the medication when he is off work that would be fine. Please find out from the patient if this is his intention.

## 2013-07-30 NOTE — Telephone Encounter (Signed)
Form and chart are back on your desk

## 2013-07-30 NOTE — Telephone Encounter (Signed)
Dr Lorin PicketScott, the form was given to the patient but the note that  Needed to be added to it was not on the form. The nurses  Stated that it was ready to go out. Did you intend for them to find Out what an how he was taking his meds while driving then finish  The form before he picked it up? 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

## 2013-07-30 NOTE — Telephone Encounter (Signed)
Form done. ( he has safely taken pain med for years without side effects) (he is to use xanax only when not working and rarely at that)

## 2013-07-30 NOTE — Telephone Encounter (Signed)
Discussed with patient. Pt states he does not use meds when he is working. Pt notified form is ready for pickup.

## 2013-08-02 NOTE — Telephone Encounter (Signed)
Patient was talked to over the phone. He was told specifically not to use the Xanax when he is driving. He also has used Percocet for years at a stable dose. Does not cause him drowsiness. He is been able to work and drive personal vehicle safely for years. 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 He will keep all his standard followup

## 2013-09-10 ENCOUNTER — Ambulatory Visit (INDEPENDENT_AMBULATORY_CARE_PROVIDER_SITE_OTHER): Payer: 59 | Admitting: Family Medicine

## 2013-09-10 ENCOUNTER — Encounter: Payer: Self-pay | Admitting: Family Medicine

## 2013-09-10 VITALS — BP 120/84 | Temp 98.4°F | Ht 72.0 in | Wt 247.0 lb

## 2013-09-10 DIAGNOSIS — R252 Cramp and spasm: Secondary | ICD-10-CM

## 2013-09-10 DIAGNOSIS — R290 Tetany: Secondary | ICD-10-CM

## 2013-09-10 MED ORDER — ALPRAZOLAM 0.25 MG PO TABS: 0.2500 mg | ORAL_TABLET | Freq: Two times a day (BID) | ORAL | Status: AC | PRN

## 2013-09-10 MED ORDER — CHLORZOXAZONE 500 MG PO TABS
500.0000 mg | ORAL_TABLET | Freq: Four times a day (QID) | ORAL | Status: AC | PRN
Start: 2013-09-10 — End: ?

## 2013-09-10 NOTE — Progress Notes (Signed)
   Subjective:    Patient ID: Elita BooneJerry W Montejano, male    DOB: [birth year], 33 y.o.   MRN: [id number]  HPI Patient is here today because he has been shaky, jerking and cramping. Confusion noted at times also. Patient states that symptoms started on Tuesday. Sleep deprivation and stress is also an issue for this patient.  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    Review of Systems He states that this spasms only occurred in the hands and forearms did not occur in the feet. Has not had any myoclonic jerking associated with any type of seizure no loss of consciousness.    Objective:   Physical Exam  On today's exam lungs clear hearts regular pulse normal extremities no edema negative Tinel's. Negative Phalen's. No sign of any muscle spasms.      Assessment & Plan:  I believe the muscle spasms were related to excessive fatigue and tiredness we will check potassium thyroid and calcium. Patient is now working a different job getting adequate rest this should help followup if ongoing trouble muscle relaxer prescribed may use at home only

## 2013-09-11 LAB — BASIC METABOLIC PANEL
BUN: 13 mg/dL (ref 6–23)
CALCIUM: 9.4 mg/dL (ref 8.4–10.5)
CO2: 29 mEq/L (ref 19–32)
Chloride: 99 mEq/L (ref 96–112)
Creat: 1.25 mg/dL (ref 0.50–1.35)
Glucose, Bld: 80 mg/dL (ref 70–99)
POTASSIUM: 4.3 meq/L (ref 3.5–5.3)
Sodium: 137 mEq/L (ref 135–145)

## 2013-09-11 LAB — TSH: TSH: 1.545 u[IU]/mL (ref 0.350–4.500)

## 2013-10-20 ENCOUNTER — Telehealth: Payer: Self-pay | Admitting: Family Medicine

## 2013-10-20 MED ORDER — OXYCODONE-ACETAMINOPHEN 7.5-325 MG PO TABS: 1.0000 | ORAL_TABLET | Freq: Three times a day (TID) | ORAL | Status: AC | PRN

## 2013-10-20 NOTE — Telephone Encounter (Signed)
appt for physical on 9/29

## 2013-10-20 NOTE — Telephone Encounter (Signed)
Patient has appointment on 9/29 for physical but not for medcheck.

## 2013-10-20 NOTE — Addendum Note (Signed)
Addended by: Margaretha Sheffield on: 10/20/2013 03:35 PM   Modules accepted: Orders

## 2013-10-20 NOTE — Telephone Encounter (Signed)
Rx up front for pick up. Patient notified. 

## 2013-10-20 NOTE — Telephone Encounter (Signed)
Patient wants refill on perocet 7.5/325 he states has appointment 9/29  Last seen in march. Medication last filled in June.

## 2013-10-20 NOTE — Telephone Encounter (Signed)
May give 1 additional prescription for this. #90. Use previous directions keep well this visit.

## 2013-10-25 ENCOUNTER — Emergency Department: Payer: Self-pay | Admitting: Emergency Medicine

## 2013-10-25 ENCOUNTER — Ambulatory Visit (HOSPITAL_COMMUNITY)
Admission: AD | Admit: 2013-10-25 | Discharge: 2013-10-25 | Disposition: A | Discharge status: Home / Self Care | Payer: BC Managed Care – PPO | Source: Other Acute Inpatient Hospital | Attending: Emergency Medicine | Admitting: Emergency Medicine

## 2013-10-25 DIAGNOSIS — T68XXXA Hypothermia, initial encounter: Secondary | ICD-10-CM | POA: Diagnosis not present

## 2013-10-25 DIAGNOSIS — I609 Nontraumatic subarachnoid hemorrhage, unspecified: Secondary | ICD-10-CM | POA: Diagnosis not present

## 2013-10-25 DIAGNOSIS — R55 Syncope and collapse: Secondary | ICD-10-CM | POA: Insufficient documentation

## 2013-10-25 DIAGNOSIS — I469 Cardiac arrest, cause unspecified: Secondary | ICD-10-CM | POA: Diagnosis present

## 2013-10-25 DIAGNOSIS — X31XXXA Exposure to excessive natural cold, initial encounter: Secondary | ICD-10-CM | POA: Insufficient documentation

## 2013-10-25 LAB — COMPREHENSIVE METABOLIC PANEL
ALBUMIN: 3.5 g/dL (ref 3.4–5.0)
ALK PHOS: 75 U/L
Albumin: 3.1 g/dL — ABNORMAL LOW (ref 3.4–5.0)
Alkaline Phosphatase: 82 U/L
Anion Gap: 21 — ABNORMAL HIGH (ref 7–16)
Anion Gap: 15 (ref 7–16)
BILIRUBIN TOTAL: 0.7 mg/dL (ref 0.2–1.0)
BILIRUBIN TOTAL: 1.1 mg/dL — AB (ref 0.2–1.0)
BUN: 20 mg/dL — ABNORMAL HIGH (ref 7–18)
BUN: 21 mg/dL — ABNORMAL HIGH (ref 7–18)
CALCIUM: 8.7 mg/dL (ref 8.5–10.1)
CALCIUM: 7.4 mg/dL — AB (ref 8.5–10.1)
CHLORIDE: 107 mmol/L (ref 98–107)
CO2: 18 mmol/L — AB (ref 21–32)
Chloride: 93 mmol/L — ABNORMAL LOW (ref 98–107)
Co2: 18 mmol/L — ABNORMAL LOW (ref 21–32)
Creatinine: 3.16 mg/dL — ABNORMAL HIGH (ref 0.60–1.30)
Creatinine: 2.84 mg/dL — ABNORMAL HIGH (ref 0.60–1.30)
GFR CALC AF AMER: 28 — AB
GFR CALC AF AMER: 32 — AB
GFR CALC NON AF AMER: 24 — AB
GFR CALC NON AF AMER: 27 — AB
GLUCOSE: 335 mg/dL — AB (ref 65–99)
Glucose: 72 mg/dL (ref 65–99)
Osmolality: 280 (ref 275–301)
Osmolality: 281 (ref 275–301)
POTASSIUM: 6.4 mmol/L — AB (ref 3.5–5.1)
Potassium: 3.8 mmol/L (ref 3.5–5.1)
SGOT(AST): >2000 U/L — ABNORMAL HIGH (ref 15–37)
SGPT (ALT): >4000 U/L — ABNORMAL HIGH
SODIUM: 132 mmol/L — AB (ref 136–145)
Sodium: 140 mmol/L (ref 136–145)
Total Protein: 7.6 g/dL (ref 6.4–8.2)
Total Protein: 6.9 g/dL (ref 6.4–8.2)

## 2013-10-25 LAB — DRUG SCREEN, URINE
Amphetamines, Ur Screen: NEGATIVE (ref ?–1000)
BENZODIAZEPINE, UR SCRN: POSITIVE (ref ?–200)
Barbiturates, Ur Screen: NEGATIVE (ref ?–200)
Cannabinoid 50 Ng, Ur ~~LOC~~: NEGATIVE (ref ?–50)
Cocaine Metabolite,Ur ~~LOC~~: POSITIVE (ref ?–300)
MDMA (Ecstasy)Ur Screen: NEGATIVE (ref ?–500)
METHADONE, UR SCREEN: NEGATIVE (ref ?–300)
Opiate, Ur Screen: POSITIVE (ref ?–300)
PHENCYCLIDINE (PCP) UR S: NEGATIVE (ref ?–25)
TRICYCLIC, UR SCREEN: NEGATIVE (ref ?–1000)

## 2013-10-25 LAB — URINALYSIS, COMPLETE
BILIRUBIN, UR: NEGATIVE
Hyaline Cast: 3
Ketone: NEGATIVE
LEUKOCYTE ESTERASE: NEGATIVE
Nitrite: NEGATIVE
Ph: 5 (ref 4.5–8.0)
Protein: 75
Specific Gravity: 1.015 (ref 1.003–1.030)
Squamous Epithelial: <1

## 2013-10-25 LAB — CK-MB
CK-MB: 18.9 ng/mL — ABNORMAL HIGH (ref 0.5–3.6)
CK-MB: 83.5 ng/mL — ABNORMAL HIGH (ref 0.5–3.6)

## 2013-10-25 LAB — CBC
HCT: 52.7 % — AB (ref 40.0–52.0)
HGB: 15.3 g/dL (ref 13.0–18.0)
MCH: 29.5 pg (ref 26.0–34.0)
MCHC: 29.1 g/dL — ABNORMAL LOW (ref 32.0–36.0)
MCV: 101 fL — ABNORMAL HIGH (ref 80–100)
Platelet: 167 10*3/uL (ref 150–440)
RBC: 5.2 10*6/uL (ref 4.40–5.90)
RDW: 15.5 % — ABNORMAL HIGH (ref 11.5–14.5)
WBC: 13.4 10*3/uL — AB (ref 3.8–10.6)

## 2013-10-25 LAB — TROPONIN I: Troponin-I: 0.43 ng/mL — ABNORMAL HIGH

## 2013-10-26 ENCOUNTER — Encounter: Payer: Self-pay | Admitting: Family Medicine

## 2013-11-02 ENCOUNTER — Encounter: Payer: 59 | Admitting: Family Medicine

## 2013-11-03 ENCOUNTER — Encounter: Payer: 59 | Admitting: Family Medicine

## 2013-11-04 DEATH — deceased
